# Patient Record
Sex: Female | Born: 1950 | Race: White | Hispanic: No | Marital: Married | Smoking: Former smoker
Health system: Southern US, Community
[De-identification: ages and names within clinical notes are randomized; demographics above are authoritative.]

## PROBLEM LIST (undated history)

## (undated) DIAGNOSIS — I219 Acute myocardial infarction, unspecified: Secondary | ICD-10-CM

## (undated) DIAGNOSIS — R112 Nausea with vomiting, unspecified: Secondary | ICD-10-CM

## (undated) DIAGNOSIS — I10 Essential (primary) hypertension: Secondary | ICD-10-CM

## (undated) DIAGNOSIS — K219 Gastro-esophageal reflux disease without esophagitis: Secondary | ICD-10-CM

## (undated) DIAGNOSIS — E119 Type 2 diabetes mellitus without complications: Secondary | ICD-10-CM

## (undated) DIAGNOSIS — D649 Anemia, unspecified: Secondary | ICD-10-CM

## (undated) DIAGNOSIS — Z9889 Other specified postprocedural states: Secondary | ICD-10-CM

## (undated) DIAGNOSIS — I2510 Atherosclerotic heart disease of native coronary artery without angina pectoris: Secondary | ICD-10-CM

## (undated) HISTORY — PX: CARDIAC CATHETERIZATION: SHX172

## (undated) HISTORY — PX: CHOLECYSTECTOMY: SHX55

---
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
## 1993-12-30 HISTORY — PX: ABDOMINAL HYSTERECTOMY: SHX81

## 2000-06-27 ENCOUNTER — Encounter: Payer: Self-pay | Admitting: General Surgery

## 2000-06-27 ENCOUNTER — Encounter: Admission: RE | Admit: 2000-06-27 | Discharge: 2000-06-27 | Payer: Self-pay | Admitting: General Surgery

## 2000-06-30 ENCOUNTER — Ambulatory Visit (HOSPITAL_BASED_OUTPATIENT_CLINIC_OR_DEPARTMENT_OTHER): Admission: RE | Admit: 2000-06-30 | Discharge: 2000-06-30 | Payer: Self-pay | Admitting: General Surgery

## 2000-06-30 ENCOUNTER — Encounter (INDEPENDENT_AMBULATORY_CARE_PROVIDER_SITE_OTHER): Payer: Self-pay | Admitting: *Deleted

## 2000-06-30 ENCOUNTER — Encounter: Payer: Self-pay | Admitting: General Surgery

## 2003-12-31 HISTORY — PX: CORONARY ANGIOPLASTY: SHX604

## 2004-03-31 ENCOUNTER — Emergency Department (HOSPITAL_COMMUNITY): Admission: EM | Admit: 2004-03-31 | Discharge: 2004-03-31 | Payer: Self-pay | Admitting: Emergency Medicine

## 2004-09-26 DIAGNOSIS — I219 Acute myocardial infarction, unspecified: Secondary | ICD-10-CM

## 2004-09-26 HISTORY — DX: Acute myocardial infarction, unspecified: I21.9

## 2004-09-27 ENCOUNTER — Inpatient Hospital Stay (HOSPITAL_COMMUNITY): Admission: EM | Admit: 2004-09-27 | Discharge: 2004-10-01 | Payer: Self-pay | Admitting: Emergency Medicine

## 2004-10-18 ENCOUNTER — Ambulatory Visit (HOSPITAL_COMMUNITY): Admission: RE | Admit: 2004-10-18 | Discharge: 2004-10-19 | Payer: Self-pay | Admitting: Cardiology

## 2004-11-20 ENCOUNTER — Encounter (HOSPITAL_COMMUNITY): Admission: RE | Admit: 2004-11-20 | Discharge: 2004-12-20 | Payer: Self-pay | Admitting: Cardiology

## 2008-04-16 ENCOUNTER — Ambulatory Visit (HOSPITAL_COMMUNITY): Admission: RE | Admit: 2008-04-16 | Discharge: 2008-04-16 | Payer: Self-pay | Admitting: Chiropractic Medicine

## 2008-12-11 ENCOUNTER — Emergency Department (HOSPITAL_COMMUNITY): Admission: EM | Admit: 2008-12-11 | Discharge: 2008-12-11 | Payer: Self-pay | Admitting: Emergency Medicine

## 2009-09-12 ENCOUNTER — Encounter: Admission: RE | Admit: 2009-09-12 | Discharge: 2009-09-12 | Payer: Self-pay | Admitting: Family Medicine

## 2011-02-04 IMAGING — US US ABDOMEN COMPLETE
1 series · 13 of 25 positions shown · non-contrast
Comparison: None

CLINICAL DATA: Elevated liver function test, left upper quadrant
bulge

COMPLETE ABDOMINAL ULTRASOUND

[Series 1: us abdomen complete · 0.12mm/px · 13 of 86 slices shown]
[im 1/86]
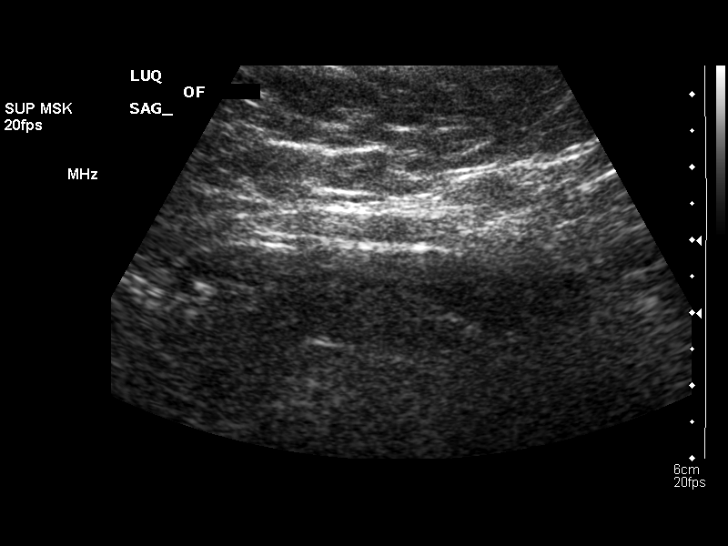
[im 8/86]
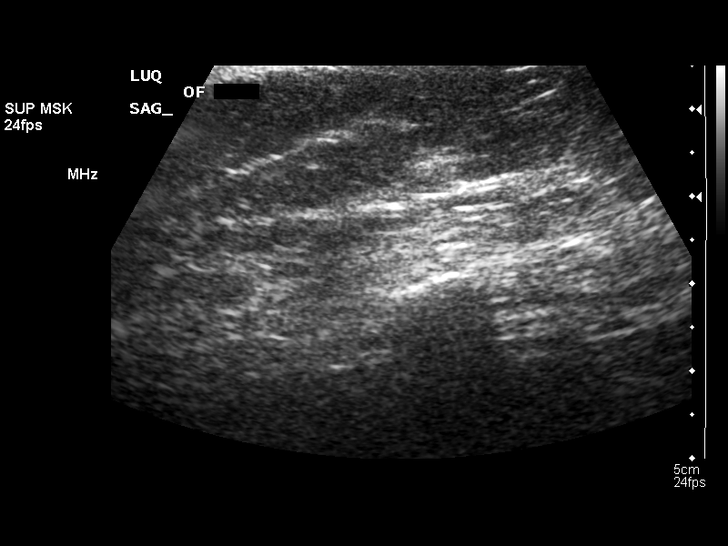
[im 15/86]
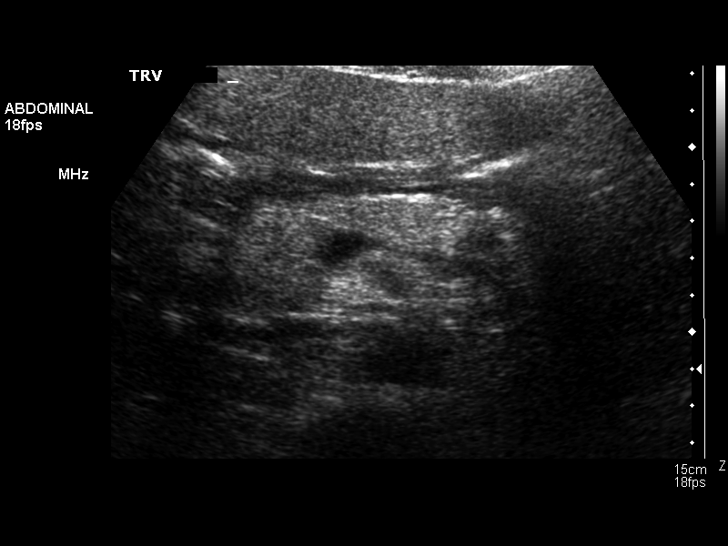
[im 22/86]
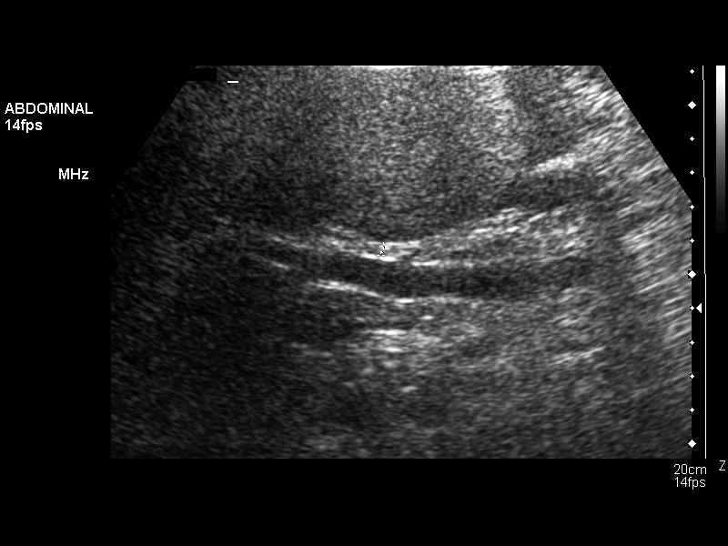
[im 29/86]
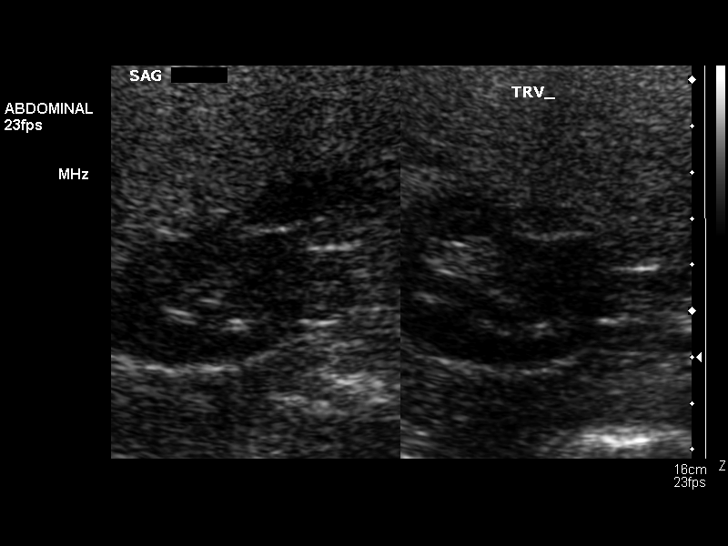
[im 36/86]
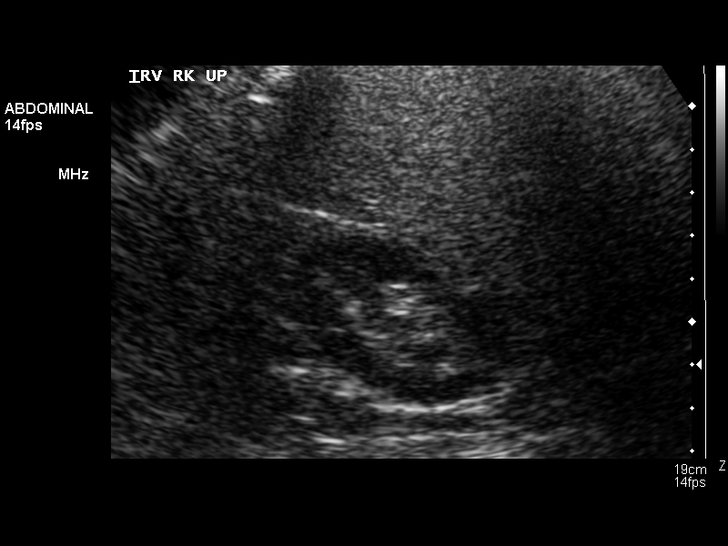
[im 43/86]
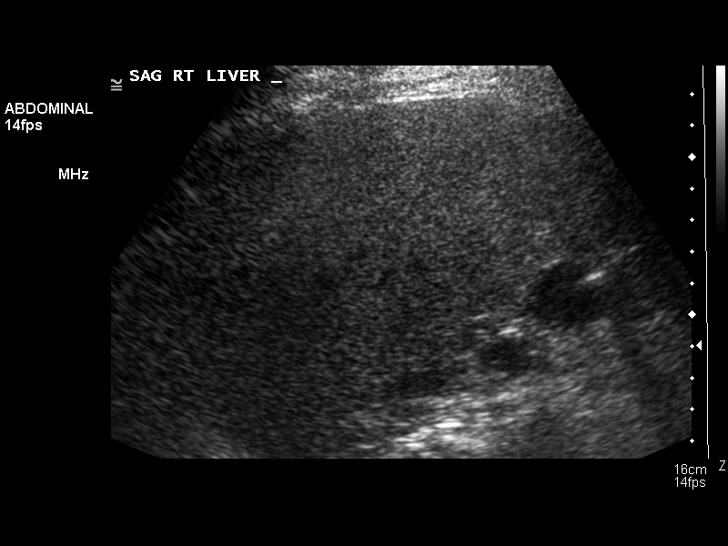
[im 50/86]
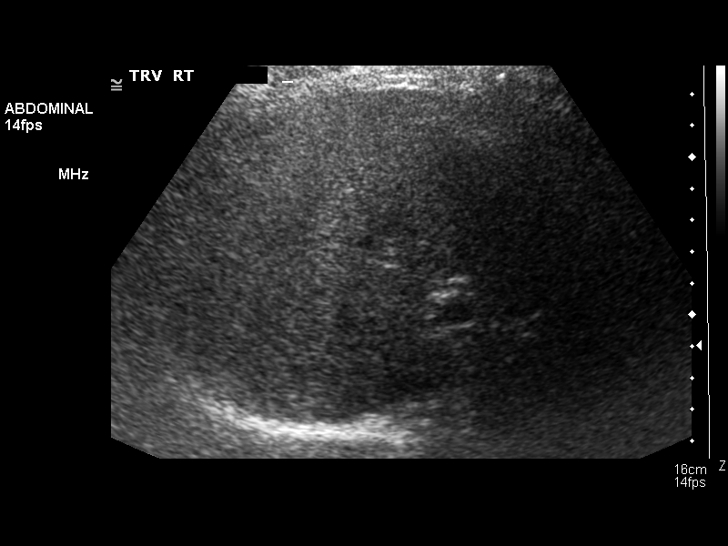
[im 57/86]
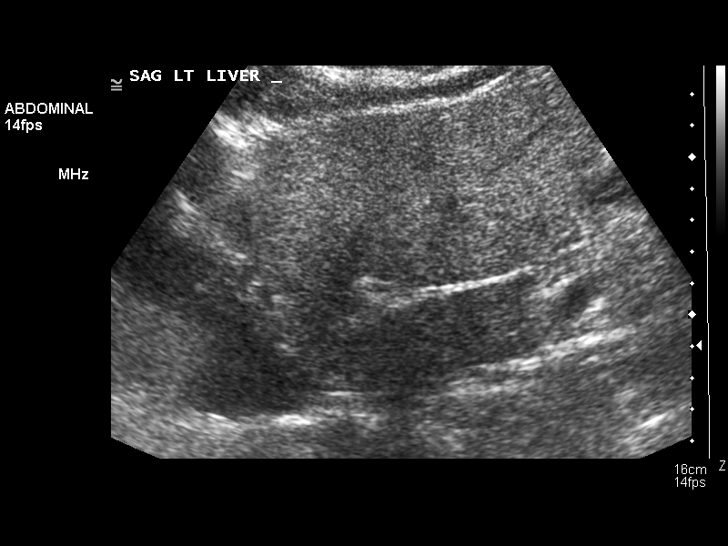
[im 64/86]
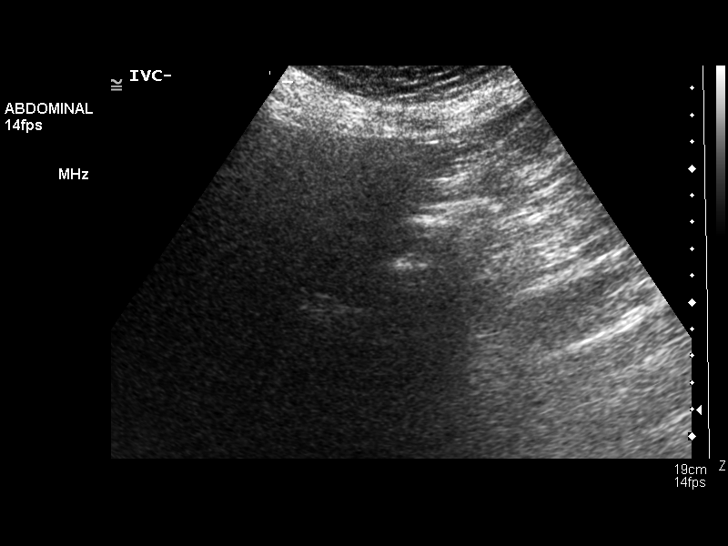
[im 71/86]
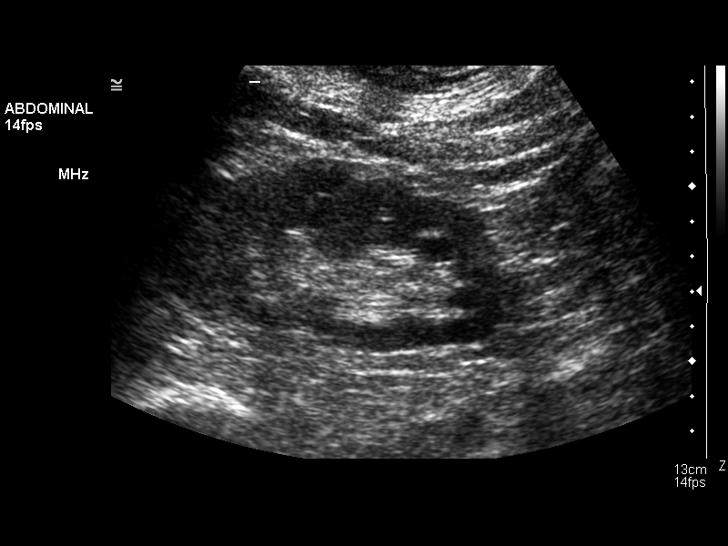
[im 78/86]
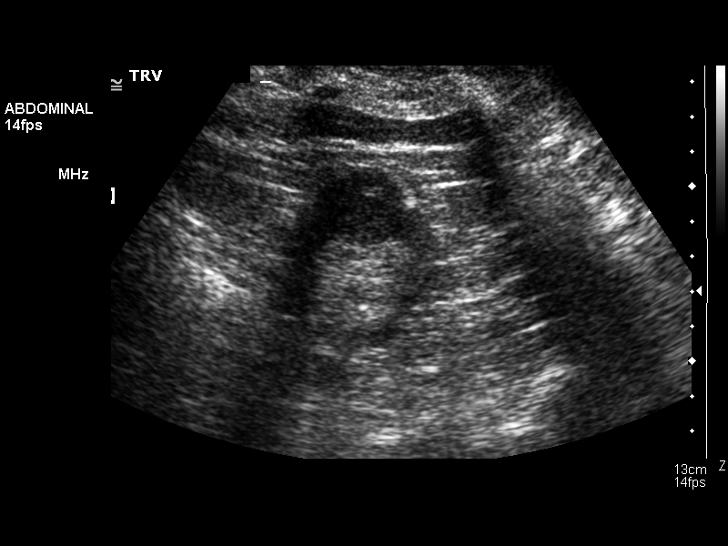
[im 86/86]
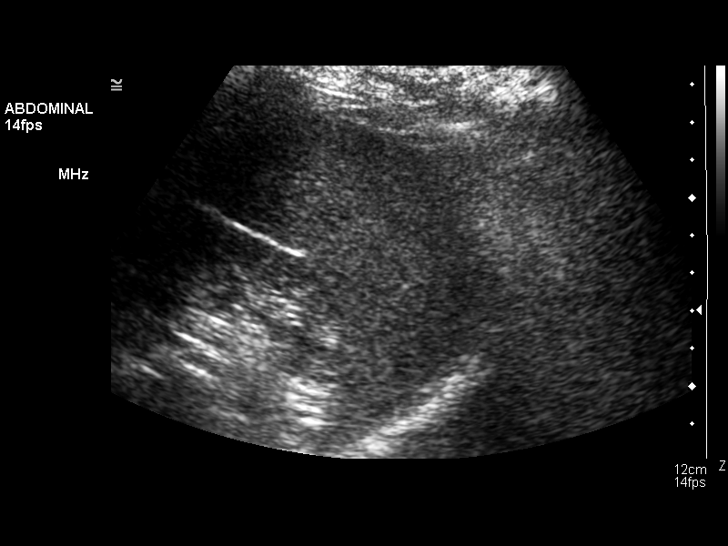

[13 of 25 positions shown; findings below may reference images not displayed]

FINDINGS: Gallbladder:  The gallbladder has been removed surgically

Common bile duct:  The common bile duct is normal measuring up to
3.2 mm in diameter distally.

Liver:  The liver is very echogenic consistent with fatty
infiltration.  No focal abnormality is seen.

IVC:  The IVC is obscured by bowel gas.

Pancreas:  The pancreas appears normal although the tail of the
pancreas is obscured by bowel gas.

Spleen:  The spleen is normal in size.

Right Kidney:  No hydronephrosis is seen.  The right kidney
measures 12.8 cm sagittally.  There is a probable complex cyst in
the upper pole of 2.2 x 2.2 x 2.1 cm.

Left Kidney:  No hydronephrosis.  Left kidney measures 13.8 cm
sagittally.  There is a lobular contour to the left kidney most
likely normal, i.e. fetal lobulation.

Abdominal aorta:  The aorta is normal in caliber.

No mass  is detected at the site questioned in the left upper
quadrant - left flank which appears fatty by ultrasound.
IMPRESSION: 1.  Fatty infiltration of the liver.  No ductal dilatation.
2.  Lobular contour of the left kidney most likely within normal
limits.  No definite mass.  Complex cyst in the upper pole of the
right kidney.
3.  At the site questioned in the left upper quadrant - left flank,
only fatty tissue is demonstrated by ultrasound.  No mass is seen.

## 2011-05-17 NOTE — H&P (Signed)
Claire Morgan, Claire Morgan                ACCOUNT NO.:  [id number]   MEDICAL RECORD NO.:  [id number]          PATIENT TYPE:  INP   LOCATION:  2921                         FACILITY:  [facility]   PHYSICIAN:  Cristy Hilts. Jacinto Halim, M.D.     DATE OF BIRTH:  [birth year]   DATE OF ADMISSION:  09/27/2004  DATE OF DISCHARGE:                                HISTORY & PHYSICAL   CHIEF COMPLAINT:  Chest pain.   HISTORY OF PRESENT ILLNESS:  Claire Morgan is a 60 year old female who is  followed by Dr. Andrey Campanile.  She has no prior history of coronary artery disease  or MI.  She presented to Dr. Tawana Scale office with complaint of chest pain  off and on x24 hours.  Her EKG was abnormal.  The patient describes  substernal chest burning which started yesterday morning while driving to  work.  Symptoms lasted a few minutes and resolved spontaneously.  She did  have some radiation to her throat and arms.  She denies any nausea,  diaphoresis or shortness of breath.  She denies any recent exertional chest  pain.  She had recurrent symptoms off and on all day yesterday.  Today, her  symptoms seemed to last longer, more than 5 minutes.  EKG in her family  doctor's office showed septal Q waves.  She was given four baby aspirin and  a nitroglycerin with resolution of her symptoms.  She is seen in the  emergency room now at [facility] after she was transferred by EMS.  Currently, she is pain free.   PAST MEDICAL HISTORY:  1.  Treated hypertension.  2.  Hyperlipidemia on diet for this.  3.  Non-insulin dependent diabetes mellitus and tries to control this with      diet.   PAST SURGICAL HISTORY:  1.  Cholecystectomy 18 years ago.  2.  Hysterectomy.  3.  Ovarian tumor removed as a teenager.  4.  Right breast lumpectomy.  5.  History of LFTs that have been elevated.  Etiology not clear.   MEDICATIONS:  1.  Avapro 150 mg a day.  2.  Toprol XL 12.5 mg a day.  3.  Premarin 6.25 mg a day.  4.  Nexium q.d.   ALLERGIES:  No  known drug allergies, but is intolerant to CODEINE.   SOCIAL HISTORY:  She is married.  She is a two pack a day smoker.  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  She has two children and four  grandchildren.  She does not drink alcohol.   FAMILY HISTORY:  Remarkable for coronary disease.  Her brother had a bypass  in his 24s.  Her mother is alive at 55 and has stents placed in the past.  Father is alive at 10 and has had bypass.   REVIEW OF SYSTEMS:  Essentially unremarkable except as noted above.  She  denies any history of GI bleeding or melena.  She has not had recent fever,  chills or hemoptysis.  She denies any kidney disease or dysuria.  She has  not had thyroid problems  in the past as far as she knows.  Review of systems  otherwise unremarkable except as noted above.   PHYSICAL EXAMINATION:  VITAL SIGNS:  Blood pressure 137/74, pulse 112,  respirations 16.  GENERAL:  She is a well-developed, overweight female in no acute distress.  HEENT:  Normocephalic, extraocular movements intact.  Sclerae nonicteric.  Conjunctivae within normal limits.  NECK:  Without bruit and without JVD.  CHEST:  Clear to auscultation and percussion.  CARDIAC:  Regular rate and rhythm without murmurs, rubs or gallops.  She  does have an S4.  ABDOMEN:  Nontender.  She has no hepatosplenomegaly.  She has a right upper  quadrant cholecystectomy scar.  EXTREMITIES:  Without edema.  Distal pulses are 3+/4.  There are no femoral  bruits noted.  NEUROLOGIC:  Grossly intact.  She is awake, alert, oriented and cooperative.  She moves all extremities without deficits.  SKIN:  Warm and dry.   LABORATORY DATA AND X-RAY FINDINGS:  EKG in the emergency room shows septal  Q waves with poor anterior R wave progression, slight ST elevation in V2 and  some anterior T wave inversion which was new from her previous EKG done at  Dr. Tawana Scale office.  Labs are pending as is her chest x-ray.   IMPRESSION:  1.   Unstable angina with possible anterior septal myocardial infarction.  2.  History of hypertension.  3.  History of non-insulin dependent diabetes mellitus, diet-controlled.  4.  History of hyperlipidemia, diet-controlled.  5.  History of smoking.  6.  Family history of coronary disease.  7.  History of elevated liver function tests with unclear etiology.  8.  Status post cholecystectomy in the past.   PLAN:  The patient was put on IV heparin and nitrates and will be taken  directly to the catheterization lab from the emergency room.       LKK/MEDQ  D:  09/28/2004  T:  09/28/2004  Job:  [id number]   cc:   Gloriajean Dell. Andrey Campanile, M.D.  [address]  [place]  [place] [address]  Fax: [phone]

## 2011-05-17 NOTE — Op Note (Signed)
[name]  Patient:    Claire Morgan, Claire Morgan                       MRN: [id number] Proc. Date: 06/30/00 Adm. Date:  [id number] Attending:  Carson Myrtle                           Operative Report  PREOPERATIVE DIAGNOSIS:  Abnormal mammogram, right.  POSTOPERATIVE DIAGNOSIS:  Abnormal mammogram, right.  PROCEDURE:  Excision with needle localization, mass right breast.  ANESTHESIA:  CRNA standby.  Ms. [name] is an, otherwise, healthy 60 year old with a new, more extensive, abnormal calcification, right breast.  She wishes to proceed with an open biopsy after options had been discussed.  The patient was brought to [facility] and a very nice needle localization was accomplished by Dr. Purcell Mouton and the patient was taken to room 7.  She was placed supine, IV started and sedation given.  The needle was nicely located at the 2 oclock position at the areolar edge of the right breast.  It appeared to penetrate the calcifications.  This area was prepped and draped in the usual fashion.  0.25% Marcaine with epinephrine was used throughout for local anesthesia.  A curvilinear incision made around the skin areolar junction and taken down through the subcutaneous tissue.  A generous core of biopsy around the shaft of the needle was excised all the way down and beyond the tip of the needle.  This was accomplished without difficulty and appeared to contain the area in question.  It was sent for specimen mammography.  Meanwhile, the bleeding was controlled with a cautery.  The wound was perfectly dry.  It was closed in layers with 3-0 Vicryl and 4-0 Monocryl.  Steri-Strips applied.  Counts correct.  Specimen mammogram appeared to me to contain the calcifications in question.  The specimen was sent to the laboratory for routine sections.  The patient tolerated it well and was removed to the recovery room in good condition.  Written and verbal instructions  given to her and her family, including Percocet 5 mg #20 and she will be seen in follow up as an outpatient. DD:    /  / TD:  07/01/00 Job: [id number]/[id number]

## 2011-05-17 NOTE — Cardiovascular Report (Signed)
NAMECHRISY, HILLEBRAND                ACCOUNT NO.:  [id number]   MEDICAL RECORD NO.:  [id number]          PATIENT TYPE:  INP   LOCATION:  2921                         FACILITY:  [facility]   PHYSICIAN:  Cristy Hilts. Jacinto Halim, M.D.     DATE OF BIRTH:  1951/07/04   DATE OF PROCEDURE:  09/27/2004  DATE OF DISCHARGE:                              CARDIAC CATHETERIZATION   PROCEDURE PERFORMED:  1.  Left ventriculography.  2.  Selective right and left coronary arteriography.  3.  Percutaneous transluminal coronary angioplasty and balloon angioplasty      of the mid left anterior descending artery.  4.  Thrombectomy using Expo catheter.  5.  Percutaneous transluminal coronary angioplasty and stenting of the mid      left anterior descending artery with a 2.5 x 23-mm Cypher followed by      post stent dilatation with a 2.75 x 200-mm Quantum.   INDICATION:  Ms. Claire Morgan is a 60 year old female with history that was  not completely available prior to me bringing the patient emergently to the  cardiac catheterization lab.  She was admitted with chest pain which is  going on since yesterday.  This afternoon, she had severe crushing chest  pain 9/10 in intensity.  She had significant dynamic EKG changes in the form  of mild ST segment elevation in 1 and AVL and also the anterior leads.  This  EKG change reverted back to baseline.  However, there was symmetrical T-wave  inversion in V1-V6 suggestive of evolving anterior wall myocardial  infarction.  Given her chest pain and markedly abnormal EKG, she was brought  to the cardiac catheterization lab on an emergent basis.   HEMODYNAMIC DATA:  1.  The left ventricular pressures were 106/3 with end-diastolic pressure of      7 mmHg.  2.  Aortic pressure 99/70 with a mean of 85 mmHg.  3.  There was no significant pressure gradient across the aortic valve.   ANGIOGRAPHIC DATA:  Left ventricle:  Left ventricular systolic function was  moderately depressed at  around 40-45% with mid to distal anterior,  anterolateral and anteroapical and inferoapical akinesis.  There was no  significant mitral regurgitation.   Right coronary artery:  The right coronary artery is a large caliber vessel  and a dominant vessel.  It is normal.   Left main coronary artery:  Left main coronary artery is a large caliber  vessel. It is normal.   Circumflex coronary artery:  Circumflex is a large caliber vessel.  It gives  origin to large obtuse marginal one and continues in the AV groove.  There  is mild luminal irregularity of the distal circumflex.  The obtuse marginal  one has a focal 50% stenosis and another 90% stenosis.   Left anterior descending artery:  Left anterior descending artery is a  moderate caliber vessel for her age.  It has diffuse disease in its distal  segment.  Gives origin to a moderate size diagonal one, however, there is  diffuse disease of the distal diagonal.  The distal diagonal has 90%  stenosis.  The LAD itself after the origin of a large septal perforator has  a 99% ulcerated complex stenosis.   INTERVENTIONAL DATA:  Successful percutaneous transluminal coronary  angioplasty and stenting of the mid left anterior descending artery.  The  stenosis was reduced from 99% to 0% with TIMI-3 to TIMI-3 flow maintained at  the end of the procedure.  A 2.5 x 23-mm Cypher stent was deployed at 12  atmospheric pressure and was post dilated with a 2.75 x 20-mm Quantum at 10  atmospheric pressure for 20 seconds.  At the end of the procedure, there was  no evidence of dissection.  No evidence of thrombus.  Excellent TIMI-3 flow  was maintained.   Balloon predilation of the mid LAD was performed with a 2.5 x 20-mm Quantum.  At that time, there was heavy thrombus burden noted at the lesion site.  The  large septal perforator had no reflow phenomena.  Hence, an Expo catheter  was utilized to perform thrombectomy.  Following thrombectomy, TIMI-3 flow   was established both in the LAD and also in the septal perforator.  Following this, the stent was deployed and the stent was post dilated with  excellent results overall.  There was no compromise in the large septal  perforator.   OVERALL IMPRESSION:  1.  Evolving acute anterior and anterolateral wall myocardial infarction      status post successful percutaneous transluminal coronary angioplasty      and stenting of the mid left anterior descending.  2.  Residual circumflex stenosis.   RECOMMENDATIONS:  The patient was initially started on ReoPro.  However,  once the lab results were available that were sent emergently, it was  realized that she has thrombocytopenia.  I also had the opportunity to  evaluate her labs from [facility] and she has chronic  thrombocytopenia and chronically elevated LFTs.  Given this, ReoPro was  discontinued.  The patient will be continued on aspirin and Plavix for now.  Although she has mildly abnormal liver enzymes, as per patient, she has had  extensive evaluation.  Hence, low dose of Zocor will be started given her  acuity of her cardiac illness.  The LFTs will be closely followed.   Evaluation for thrombocytopenia including malignancy should be considered.   TECHNIQUE OF PROCEDURE:  Under usual sterile precautions, using a 6 French  right femoral arterial access, a 6 [place] multipurpose pigtail catheter was  advanced into the ascending aorta over a 0.035-inch J wire.  The catheter  was then gently advanced to the left ventricle and left ventricular  pressures were monitored.  Hand contrast injection of the left ventricle was  performed both in the LAO and RAO projection.  The catheter was flushed with  saline and pulled back into the ascending aorta and pressure gradient across  the aortic valve was monitored.  The right coronary artery was selectively engaged and angiography was performed.  In a similar fashion, left main  coronary  artery was selectively engaged and angiography was performed.  Then, the catheter was pulled out of the body in the usual fashion.  The 7  French sheath was introduced at the femoral artery and a 7 [place] FL-3.5  guide was utilized to engage the left main coronary artery.  After having  adequate anticoagulation, a 190-cmm x 0.014 inch ATW guide wire was utilized  to cross through the LAD and the lesion length was carefully measured.  Then, a 2.75 x 20-mm Maverick  balloon was advanced over this guide wire and  after confirming the position of this balloon, the balloon was inflated at  10 atmospheric pressure for 47 seconds.  Then, the balloon was deflated and  pulled back in the guiding catheter.  200 mcg of intracoronary nitroglycerin  was administered and angiography was performed.  There was thrombus burden  noted with no reflow into the large septal perforator.  Then, an Expo  catheter was utilized and two passes were made and after the passing of the  Expo catheter, excellent TIMI-3 flow was established and also markedly  improved thrombus burden.  Then, a 2.5 x 23-mm Cypher stent was advanced  over the same wire and after confirming the position of the stent, the stent  was deployed at 12 atmospheric pressure for 33 seconds and then  post dilated with a 2.75 x 20-mm Quantum at 10 atmospheric pressures.  Overall, the stenosis was reduced from 99% to 0% with TIMI-3 to TIMI-3 flow  maintained at the end of the procedure.  The patient tolerated the procedure  well.  There was no immediate complications noted.  190 mL of contrast was  administered during the angiography.       JRG/MEDQ  D:  09/27/2004  T:  09/28/2004  Job:  [id number]

## 2011-05-17 NOTE — Discharge Summary (Signed)
Claire Morgan, BAYLESS                ACCOUNT NO.:  [id number]   MEDICAL RECORD NO.:  [id number]          PATIENT TYPE:  INP   LOCATION:  3714                         FACILITY:  [facility]   PHYSICIAN:  Cristy Hilts. Jacinto Halim, M.D.     DATE OF BIRTH:  February 07, 1951   DATE OF ADMISSION:  09/27/2004  DATE OF DISCHARGE:  10/01/2004                                 DISCHARGE SUMMARY   ADMISSION DIAGNOSES:  1.  Acute myocardial infarction.  2.  Hypertension.  3.  Non-insulin-dependent diabetes mellitus, diet-controlled.  4.  Hyperlipidemia, diet-controlled.  5.  History of slightly elevated liver function tests in the past,      questionable etiology.  6.  Smoker.  7.  Family history of coronary artery disease.  8.  History of hysterectomy.  9.  History of ovarian tumor as a teenager.  10. History of cholecystectomy 18 years ago.  11. History of lumpectomy on the right.   DISCHARGE DIAGNOSES:  1.  Acute myocardial infarction.  2.  Status post emergency cardiac catheterization by Dr. Cristy Hilts. Jacinto Halim on      September 27, 2004.  He performed percutaneous transluminal coronary      angioplasty/stent to the left anterior descending.  She does have      residual coronary artery disease which will need followup.  3.  Ejection fraction at 45% at catheterization.  4.  Hypertension.  5.  Non-insulin-dependent diabetes mellitus, diet-controlled.  6.  Hyperlipidemia, diet-controlled.  7.  History of slightly elevated liver function tests in the past,      questionable etiology.  8.  Smoker.  9.  Family history of coronary artery disease.  10. History of hysterectomy.  11. History of ovarian tumor as a teenager.  12. History of cholecystectomy 18 years ago.  13. History of lumpectomy on the right.   HISTORY OF PRESENT ILLNESS:  Claire Morgan is a 60 year old white female who  presented to [facility] on the evening of September 27, 2004 with  complaints of chest pain.  She had no prior cardiac history.  At that  time,  she was sent to the ER from Dr. Gloriajean Dell. Wilson's office with history of  chest pain off and on over the last 24 hours and abnormal EKG in his office.  She described the substernal chest pain as a burning which started on the  previous morning.  Her pain radiated to the throat and arms.  There was no  nausea, diaphoresis or shortness of breath.  The symptoms had resolved  spontaneously, but recurred off and on throughout the last 2 days.  On the  day of admission, the symptoms lasted longer, over 5 minutes per episode.  The pain improved with nitroglycerin at the primary care physician's office.  There, she also receive 4 baby aspirin.  At the time of our evaluation in  the ER, she was pain-free.  EKG was abnormal with septal Q wave, poor  anterior R wave progression and slight ST elevation in V2 and she had  anterior T wave inversions which were new from earlier  EKG.   At that point, labs were pending, she was hemodynamically stable with  positive S4 on exam, but no other significant abnormalities on physical  exam.   At this point, she was seen and evaluated by Dr. Cristy Hilts. Ganji.  At this  point, she was presently pain-free.  However, with her EKG changes and  symptomatology, he felt that we needed to proceed with urgent cardiac  catheterization.  Risks and benefits of the procedure were discussed with  the patient and she was agreeable to proceed.   HOSPITAL COURSE:  On September 27, 2004, Claire Morgan underwent urgent cardiac  catheterization by Dr. Yates Decamp.  Please see the dictated report for  details.  He performed PTCA with CYPHER stenting to the LAD which went from  99% stenosis to 0% residual.  She does have significant residual disease  including a 90% stenosis of the circumflex.  EF was 45%.  There was no MR.  She tolerated the procedure well without complications.  Note that during  the case, they did get lab values back and she was thrombocytopenic,  therefore, ReoPro  was discontinued at that point.   On September 28, 2004, Claire Morgan remained stable with no chest pain.  Her  blood pressure is low in the 80s over 30s and 40s though.  Therefore, at  that time, Inspra was stopped because of hypotension and she was given  fluids.  Otherwise, she was stable.   On September 28, 2004, she underwent tobacco cessation consult.  She stated  that she had smoked her last cigarette, that she was quitting smoking at  this time.  She did not wish to have any type of assistance with this as far  as prescriptions, etc.   On September 29, 2004, she remained stable without any chest pain or other  problems.  She was transferred to telemetry.  Her blood pressure is improved  to 105/50 off of Inspra.   On September 30, 2004, she was stable.  She was having some atypical chest pain  at that point and also some headache and fatigue.  She was hemodynamically  stable, labs were stable.  Dr. Darlin Priestly planned to watch her for 24  more hours and if stable, would consider discharge in the morning.   On October 01, 2004, Claire Morgan was filling well with no chest pain or  shortness of breath.  She has been walking in the hallways without  complications.  She is not having any significant lightheadedness or other  problems.  She is maintaining sinus rhythm, sinus bradycardia, 56 and 70  beats a minute.  Her labs are stable.  Heart is in regular rhythm.  Lungs  are clear.  Her groin site has some minimal ecchymosis, but no hematoma or  bleed.  At this point, she is seen and evaluated by Dr. Lenise Herald, who  deems her stable for discharge home.   HOSPITAL CONSULTS:  None.   HOSPITAL PROCEDURES:  She underwent urgent cardiac catheterization by Dr.  Yates Decamp on September 27, 2004.  At that time, he performed percutaneous  transluminal coronary angioplasty/stent to the left anterior descending using a CYPHER stent.  She did have residual circumflex disease including a  90%  stenosis.  Ejection fraction was 45%.  Please see the dictated report  for details.  She tolerated the procedure well without complications.   EKG on admission showed sinus rhythm, septal Q waves, poor anterior R wave  progression  and slight ST elevation in V1 and anterior T wave inversions  which were new changes.   Telemetry throughout the hospitalization showed sinus rhythm and sinus  bradycardia, no other significant arrhythmia.   Chest x-ray on September 27, 2004 shows no active disease.   LABORATORY:  C-reactive protein at 0.2.  TSH normal at 1.145.  Lipid profile  shows total cholesterol 206, triglycerides 101, HDL 50, LDL 136.  Homocysteine is elevated at 16.32.  Cardiac enzymes shows CKs of 191, 235,  220.  CK-MBs were 11.1, 17.8, 15.5.  Troponin is 1.20, 2.53, 2.62.  LFTs are  normal except an AST slightly elevated at 46, ALT slightly elevated at 44,  other LFTs are completely normal.  On admission, sodium was 138, potassium  3.7, glucose 140, BUN 6, creatinine 0.9.  On admission, PT 13.4, INR 1.0,  PTT was elevated on heparin.  On admission, white count 6.6, hemoglobin  13.4, hematocrit 39.9, platelet count 132,000.   DISCHARGE MEDICATIONS:  1.  Aspirin 81 mg a day.  2.  Plavix 75 mg a day.  3.  Toprol-XL 25 mg a day.  4.  Altace 1.25 mg nightly.  5.  Zocor 20 mg a day.  6.  Nexium 40 mg a day.  7.  Premarin 0.625 mg a day.  8.  Foltx once a day.  9.  Stop your Avapro.  10. Nitroglycerin 0.4 mg sublingual as directed.   ACTIVITY:  No strenuous activity until you see Dr. Jacinto Halim.   No lifting greater than 5 pounds, driving or sexual activity for 3 days.   DIET:  Low-cholesterol diabetic diet.   WOUND CARE:  You may gently wash your groin site with warm water and soap.   DISCHARGE INSTRUCTIONS:  Have your blood drawn to check a CBC and a CMET in  about 1 week.   FOLLOWUP:  Follow up with Dr. Jacinto Halim, October 12, 2004 at 10:45 a.m.       MBE/MEDQ  D:  10/01/2004   T:  10/01/2004  Job:  [id number]   cc:   Gloriajean Dell. Andrey Campanile, M.D.  [address]  [place]  [place] [address]  Fax: [phone]

## 2011-06-17 DIAGNOSIS — E785 Hyperlipidemia, unspecified: Secondary | ICD-10-CM | POA: Insufficient documentation

## 2015-05-15 ENCOUNTER — Other Ambulatory Visit: Payer: Self-pay | Admitting: Cardiology

## 2015-11-02 DIAGNOSIS — I10 Essential (primary) hypertension: Secondary | ICD-10-CM | POA: Insufficient documentation

## 2015-11-02 DIAGNOSIS — K219 Gastro-esophageal reflux disease without esophagitis: Secondary | ICD-10-CM | POA: Insufficient documentation

## 2017-12-30 HISTORY — PX: CATARACT EXTRACTION: SUR2

## 2019-05-21 ENCOUNTER — Other Ambulatory Visit: Payer: Self-pay | Admitting: Orthopedic Surgery

## 2019-05-27 ENCOUNTER — Encounter: Payer: Self-pay | Admitting: Cardiology

## 2019-05-27 NOTE — Progress Notes (Signed)
  05/27/2019  Re: [name] DOB: [birth year] MRN: [id number]  Address: [address] [place] [address]   Dear Cindee Salt, MD,   Claire Morgan is at low risk, from a cardiac standpoint,  For her upcoming procedure.  It is ok to proceed without further cardiac testing.  If necessary to absolutely hold aspirin, please stop 1 week before.  Please call Dept: [phone] with any additional questions.   Sincerely,   Yates Decamp, MD, [facility]

## 2019-06-03 ENCOUNTER — Other Ambulatory Visit: Payer: Self-pay

## 2019-06-03 ENCOUNTER — Encounter (HOSPITAL_BASED_OUTPATIENT_CLINIC_OR_DEPARTMENT_OTHER): Payer: Self-pay | Admitting: *Deleted

## 2019-06-07 ENCOUNTER — Encounter (HOSPITAL_BASED_OUTPATIENT_CLINIC_OR_DEPARTMENT_OTHER)
Admission: RE | Admit: 2019-06-07 | Discharge: 2019-06-07 | Disposition: A | Discharge status: Home / Self Care | Payer: Medicare Other | Source: Ambulatory Visit | Attending: Orthopedic Surgery | Admitting: Orthopedic Surgery

## 2019-06-07 ENCOUNTER — Other Ambulatory Visit: Payer: Self-pay

## 2019-06-07 ENCOUNTER — Other Ambulatory Visit (HOSPITAL_COMMUNITY)
Admission: RE | Admit: 2019-06-07 | Discharge: 2019-06-07 | Disposition: A | Discharge status: Home / Self Care | Payer: Medicare Other | Source: Ambulatory Visit | Attending: Orthopedic Surgery | Admitting: Orthopedic Surgery

## 2019-06-07 DIAGNOSIS — Z01812 Encounter for preprocedural laboratory examination: Secondary | ICD-10-CM | POA: Insufficient documentation

## 2019-06-07 DIAGNOSIS — Z1159 Encounter for screening for other viral diseases: Secondary | ICD-10-CM | POA: Insufficient documentation

## 2019-06-07 LAB — BASIC METABOLIC PANEL
Anion gap: 8 (ref 5–15)
BUN: 13 mg/dL (ref 8–23)
CO2: 28 mmol/L (ref 22–32)
Calcium: 9.5 mg/dL (ref 8.9–10.3)
Chloride: 106 mmol/L (ref 98–111)
Creatinine, Ser: 0.65 mg/dL (ref 0.44–1.00)
GFR calc Af Amer: >60 mL/min (ref >60)
GFR calc non Af Amer: >60 mL/min (ref >60)
Glucose, Bld: 105 mg/dL — ABNORMAL HIGH (ref 70–99)
Potassium: 5 mmol/L (ref 3.5–5.1)
Sodium: 142 mmol/L (ref 135–145)

## 2019-06-07 NOTE — Progress Notes (Signed)
G2 pre surgical drink given to patient with instructions to finish by 0530 DOS. Pt verbalized understanding

## 2019-06-08 LAB — NOVEL CORONAVIRUS, NAA (HOSP ORDER, SEND-OUT TO REF LAB; TAT 18-24 HRS): SARS-CoV-2, NAA: NOT DETECTED

## 2019-06-10 ENCOUNTER — Ambulatory Visit (HOSPITAL_BASED_OUTPATIENT_CLINIC_OR_DEPARTMENT_OTHER)
Admission: RE | Admit: 2019-06-10 | Discharge: 2019-06-10 | Disposition: A | Discharge status: Home / Self Care | Payer: Medicare Other | Attending: Orthopedic Surgery | Admitting: Orthopedic Surgery

## 2019-06-10 ENCOUNTER — Other Ambulatory Visit: Payer: Self-pay

## 2019-06-10 ENCOUNTER — Encounter (HOSPITAL_BASED_OUTPATIENT_CLINIC_OR_DEPARTMENT_OTHER): Payer: Self-pay | Admitting: Anesthesiology

## 2019-06-10 ENCOUNTER — Encounter (HOSPITAL_BASED_OUTPATIENT_CLINIC_OR_DEPARTMENT_OTHER)
Admission: RE | Disposition: A | Discharge status: Home / Self Care | Payer: Self-pay | Source: Home / Self Care | Attending: Orthopedic Surgery

## 2019-06-10 ENCOUNTER — Ambulatory Visit (HOSPITAL_BASED_OUTPATIENT_CLINIC_OR_DEPARTMENT_OTHER): Payer: Medicare Other | Admitting: Anesthesiology

## 2019-06-10 DIAGNOSIS — E119 Type 2 diabetes mellitus without complications: Secondary | ICD-10-CM | POA: Diagnosis not present

## 2019-06-10 DIAGNOSIS — I2510 Atherosclerotic heart disease of native coronary artery without angina pectoris: Secondary | ICD-10-CM | POA: Insufficient documentation

## 2019-06-10 DIAGNOSIS — M65341 Trigger finger, right ring finger: Secondary | ICD-10-CM | POA: Diagnosis not present

## 2019-06-10 DIAGNOSIS — I10 Essential (primary) hypertension: Secondary | ICD-10-CM | POA: Insufficient documentation

## 2019-06-10 DIAGNOSIS — Z87891 Personal history of nicotine dependence: Secondary | ICD-10-CM | POA: Insufficient documentation

## 2019-06-10 DIAGNOSIS — I252 Old myocardial infarction: Secondary | ICD-10-CM | POA: Insufficient documentation

## 2019-06-10 DIAGNOSIS — M65311 Trigger thumb, right thumb: Secondary | ICD-10-CM | POA: Diagnosis present

## 2019-06-10 HISTORY — DX: Anemia, unspecified: D64.9

## 2019-06-10 HISTORY — DX: Gastro-esophageal reflux disease without esophagitis: K21.9

## 2019-06-10 HISTORY — DX: Atherosclerotic heart disease of native coronary artery without angina pectoris: I25.10

## 2019-06-10 HISTORY — DX: Type 2 diabetes mellitus without complications: E11.9

## 2019-06-10 HISTORY — PX: TRIGGER FINGER RELEASE: SHX641

## 2019-06-10 HISTORY — DX: Essential (primary) hypertension: I10

## 2019-06-10 HISTORY — DX: Other specified postprocedural states: Z98.890

## 2019-06-10 HISTORY — DX: Nausea with vomiting, unspecified: R11.2

## 2019-06-10 LAB — GLUCOSE, CAPILLARY: Glucose-Capillary: 114 mg/dL — ABNORMAL HIGH (ref 70–99)

## 2019-06-10 SURGERY — RELEASE, A1 PULLEY, FOR TRIGGER FINGER
Anesthesia: Regional | Site: Hand | Laterality: Right

## 2019-06-10 MED ORDER — LIDOCAINE HCL (PF) 1 % IJ SOLN
INTRAMUSCULAR | Status: AC
  Filled 2019-06-10: qty 5

## 2019-06-10 MED ORDER — CEFAZOLIN SODIUM-DEXTROSE 2-4 GM/100ML-% IV SOLN
2.0000 g | INTRAVENOUS | Status: AC
  Administered 2019-06-10: 2 g via INTRAVENOUS

## 2019-06-10 MED ORDER — HYDROMORPHONE HCL 1 MG/ML IJ SOLN: 0.2500 mg | INTRAMUSCULAR | Status: DC | PRN

## 2019-06-10 MED ORDER — CHLORHEXIDINE GLUCONATE 4 % EX LIQD: 60.0000 mL | Freq: Once | CUTANEOUS | Status: DC

## 2019-06-10 MED ORDER — LACTATED RINGERS IV SOLN
INTRAVENOUS | Status: DC
  Administered 2019-06-10: 08:00:00 via INTRAVENOUS

## 2019-06-10 MED ORDER — BUPIVACAINE HCL (PF) 0.25 % IJ SOLN
INTRAMUSCULAR | Status: DC | PRN
  Administered 2019-06-10: 9 mL

## 2019-06-10 MED ORDER — BUPIVACAINE HCL (PF) 0.25 % IJ SOLN
INTRAMUSCULAR | Status: AC
  Filled 2019-06-10: qty 30

## 2019-06-10 MED ORDER — MEPERIDINE HCL 25 MG/ML IJ SOLN: 6.2500 mg | INTRAMUSCULAR | Status: DC | PRN

## 2019-06-10 MED ORDER — DEXAMETHASONE SODIUM PHOSPHATE 10 MG/ML IJ SOLN
INTRAMUSCULAR | Status: AC
  Filled 2019-06-10: qty 1

## 2019-06-10 MED ORDER — FENTANYL CITRATE (PF) 100 MCG/2ML IJ SOLN
INTRAMUSCULAR | Status: AC
  Filled 2019-06-10: qty 2

## 2019-06-10 MED ORDER — SCOPOLAMINE 1 MG/3DAYS TD PT72: 1.0000 | MEDICATED_PATCH | Freq: Once | TRANSDERMAL | Status: DC | PRN

## 2019-06-10 MED ORDER — TRAMADOL HCL 50 MG PO TABS: 50.0000 mg | ORAL_TABLET | Freq: Four times a day (QID) | ORAL | 0 refills | Status: DC | PRN

## 2019-06-10 MED ORDER — CEFAZOLIN SODIUM-DEXTROSE 2-4 GM/100ML-% IV SOLN
INTRAVENOUS | Status: AC
  Filled 2019-06-10: qty 100

## 2019-06-10 MED ORDER — ONDANSETRON HCL 4 MG/2ML IJ SOLN
INTRAMUSCULAR | Status: AC
  Filled 2019-06-10: qty 2

## 2019-06-10 MED ORDER — FENTANYL CITRATE (PF) 100 MCG/2ML IJ SOLN
50.0000 ug | INTRAMUSCULAR | Status: DC | PRN
  Administered 2019-06-10 (×2): 50 ug via INTRAVENOUS

## 2019-06-10 MED ORDER — DEXAMETHASONE SODIUM PHOSPHATE 4 MG/ML IJ SOLN
INTRAMUSCULAR | Status: DC | PRN
  Administered 2019-06-10: 4 mg via INTRAVENOUS

## 2019-06-10 MED ORDER — ONDANSETRON HCL 4 MG/2ML IJ SOLN
INTRAMUSCULAR | Status: DC | PRN
  Administered 2019-06-10: 4 mg via INTRAVENOUS

## 2019-06-10 MED ORDER — PROPOFOL 10 MG/ML IV BOLUS
INTRAVENOUS | Status: DC | PRN
  Administered 2019-06-10 (×3): 10 mg via INTRAVENOUS

## 2019-06-10 MED ORDER — MIDAZOLAM HCL 2 MG/2ML IJ SOLN: 1.0000 mg | INTRAMUSCULAR | Status: DC | PRN

## 2019-06-10 MED ORDER — ONDANSETRON HCL 4 MG/2ML IJ SOLN: 4.0000 mg | Freq: Once | INTRAMUSCULAR | Status: DC | PRN

## 2019-06-10 MED ORDER — LIDOCAINE 2% (20 MG/ML) 5 ML SYRINGE
INTRAMUSCULAR | Status: AC
  Filled 2019-06-10: qty 5

## 2019-06-10 SURGICAL SUPPLY — 33 items
APL PRP STRL LF DISP 70% ISPRP (MISCELLANEOUS) ×1
BLADE SURG 15 STRL LF DISP TIS (BLADE) ×1 IMPLANT
BLADE SURG 15 STRL SS (BLADE) ×3
BNDG CMPR 9X4 STRL LF SNTH (GAUZE/BANDAGES/DRESSINGS) ×1
BNDG COHESIVE 2X5 TAN STRL LF (GAUZE/BANDAGES/DRESSINGS) ×3 IMPLANT
BNDG ESMARK 4X9 LF (GAUZE/BANDAGES/DRESSINGS) ×2 IMPLANT
CHLORAPREP W/TINT 26 (MISCELLANEOUS) ×3 IMPLANT
CORD BIPOLAR FORCEPS 12FT (ELECTRODE) IMPLANT
COVER BACK TABLE REUSABLE LG (DRAPES) ×3 IMPLANT
COVER MAYO STAND REUSABLE (DRAPES) ×3 IMPLANT
COVER WAND RF STERILE (DRAPES) IMPLANT
CUFF TOURN SGL QUICK 18X4 (TOURNIQUET CUFF) ×2 IMPLANT
DECANTER SPIKE VIAL GLASS SM (MISCELLANEOUS) ×2 IMPLANT
DRAPE EXTREMITY T 121X128X90 (DISPOSABLE) ×3 IMPLANT
DRAPE SURG 17X23 STRL (DRAPES) ×3 IMPLANT
GAUZE SPONGE 4X4 12PLY STRL (GAUZE/BANDAGES/DRESSINGS) ×3 IMPLANT
GAUZE XEROFORM 1X8 LF (GAUZE/BANDAGES/DRESSINGS) ×3 IMPLANT
GLOVE BIOGEL PI IND STRL 8.5 (GLOVE) ×1 IMPLANT
GLOVE BIOGEL PI INDICATOR 8.5 (GLOVE) ×2
GLOVE SURG ORTHO 8.0 STRL STRW (GLOVE) ×3 IMPLANT
GOWN STRL REUS W/ TWL LRG LVL3 (GOWN DISPOSABLE) ×1 IMPLANT
GOWN STRL REUS W/TWL LRG LVL3 (GOWN DISPOSABLE) ×3
GOWN STRL REUS W/TWL XL LVL3 (GOWN DISPOSABLE) ×3 IMPLANT
NDL PRECISIONGLIDE 27X1.5 (NEEDLE) ×1 IMPLANT
NEEDLE PRECISIONGLIDE 27X1.5 (NEEDLE) ×3 IMPLANT
NS IRRIG 1000ML POUR BTL (IV SOLUTION) ×3 IMPLANT
PACK BASIN DAY SURGERY FS (CUSTOM PROCEDURE TRAY) ×3 IMPLANT
STOCKINETTE 4X48 STRL (DRAPES) ×3 IMPLANT
SUT ETHILON 4 0 PS 2 18 (SUTURE) ×3 IMPLANT
SYR BULB 3OZ (MISCELLANEOUS) ×3 IMPLANT
SYR CONTROL 10ML LL (SYRINGE) ×3 IMPLANT
TOWEL GREEN STERILE FF (TOWEL DISPOSABLE) ×6 IMPLANT
UNDERPAD 30X30 (UNDERPADS AND DIAPERS) ×3 IMPLANT

## 2019-06-10 NOTE — Discharge Instructions (Addendum)

## 2019-06-10 NOTE — Anesthesia Preprocedure Evaluation (Signed)
Anesthesia Evaluation  Patient identified by MRN, date of birth, ID band Patient awake    Reviewed: Allergy & Precautions, NPO status , Patient's Chart, lab work & pertinent test results  History of Anesthesia Complications (+) PONV  Airway Mallampati: I  TM Distance: >3 FB Neck ROM: Full    Dental   Pulmonary former smoker,    Pulmonary exam normal        Cardiovascular hypertension, Pt. on medications + CAD and + Past MI  Normal cardiovascular exam     Neuro/Psych    GI/Hepatic GERD  Medicated and Controlled,  Endo/Other  diabetes, Type 2, Oral Hypoglycemic Agents  Renal/GU      Musculoskeletal   Abdominal   Peds  Hematology   Anesthesia Other Findings   Reproductive/Obstetrics                             Anesthesia Physical Anesthesia Plan  ASA: II  Anesthesia Plan: Bier Block and Bier Block-LIDOCAINE ONLY   Post-op Pain Management:    Induction:   PONV Risk Score and Plan: 3 and Ondansetron and Midazolam  Airway Management Planned: Simple Face Mask  Additional Equipment:   Intra-op Plan:   Post-operative Plan:   Informed Consent: I have reviewed the patients History and Physical, chart, labs and discussed the procedure including the risks, benefits and alternatives for the proposed anesthesia with the patient or authorized representative who has indicated his/her understanding and acceptance.       Plan Discussed with: CRNA and Surgeon  Anesthesia Plan Comments:         Anesthesia Quick Evaluation

## 2019-06-10 NOTE — Anesthesia Procedure Notes (Signed)
Anesthesia Regional Block: Bier block (IV Regional)   Pre-Anesthetic Checklist: ,, timeout performed, Correct Patient, Correct Site, Correct Laterality, Correct Procedure,, site marked, surgical consent,, at surgeon's request  Laterality: Right     Needles:  Injection technique: Single-shot  Needle Type: Other      Needle Gauge: 22     Additional Needles:   Procedures:,,,,, intact distal pulses, Esmarch exsanguination, single tourniquet utilized,  Narrative:  Start time: 06/10/2019 9:24 AM End time: 06/10/2019 9:25 AM Injection made incrementally with aspirations every 35 mL.  Performed by: Personally   Additional Notes: Esmark wrap, torq inflated to 290, neg pulse, slowly injected pres free 0.5% 103ml, pt tol well. No neg s/s

## 2019-06-10 NOTE — Brief Op Note (Signed)
06/10/2019  9:51 AM  PATIENT:  Claire Morgan  68 y.o. female  PRE-OPERATIVE DIAGNOSIS:  TRIGGER RIGHT THUMB AND RING FINGER  POST-OPERATIVE DIAGNOSIS:  TRIGGER RIGHT THUMB AND RING FINGER  PROCEDURE:  Procedure(s) with comments: RELEASE TRIGGER FINGER/A-1 PULLEY RIGHT THUMB AND RING (Right) - FAB  SURGEON:  Surgeon(s) and Role:    * Daryll Brod, MD - Primary  PHYSICIAN ASSISTANT:   ASSISTANTS: none   ANESTHESIA:   local, regional and IV sedation  EBL:  5 mL   BLOOD ADMINISTERED:none  DRAINS: none   LOCAL MEDICATIONS USED:  BUPIVICAINE   SPECIMEN:  No Specimen  DISPOSITION OF SPECIMEN:  N/A  COUNTS:  YES  TOURNIQUET:   Total Tourniquet Time Documented: Forearm (Right) - 25 minutes Total: Forearm (Right) - 25 minutes   DICTATION: .Dragon Dictation  PLAN OF CARE: Discharge to home after PACU  PATIENT DISPOSITION:  PACU - hemodynamically stable.

## 2019-06-10 NOTE — Transfer of Care (Signed)
Immediate Anesthesia Transfer of Care Note  Patient: Claire Morgan  Procedure(s) Performed: RELEASE TRIGGER FINGER/A-1 PULLEY RIGHT THUMB AND RING (Right Hand)  Patient Location: PACU  Anesthesia Type:MAC and Bier block  Level of Consciousness: awake and alert   Airway & Oxygen Therapy: Patient Spontanous Breathing and Patient connected to nasal cannula oxygen  Post-op Assessment: Report given to RN and Post -op Vital signs reviewed and stable  Post vital signs: Reviewed and stable  Last Vitals:  Vitals Value Taken Time  BP    Temp    Pulse 77 06/10/19 0954  Resp 14 06/10/19 0954  SpO2 100 % 06/10/19 0954  Vitals shown include unvalidated device data.  Last Pain:  Vitals:   06/10/19 0759  TempSrc: Oral  PainSc: 0-No pain      Patients Stated Pain Goal: 0 (85/90/93 1121)  Complications: No apparent anesthesia complications

## 2019-06-10 NOTE — Op Note (Signed)
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   ASSISTANT: none   ANESTHESIA:  Bier block with sedation and Local   INTRAVENOUS FLUIDS:  Per anesthesia flow sheet.   ESTIMATED BLOOD LOSS:  Minimal.   COMPLICATIONS:  None.   SPECIMENS:  none   TOURNIQUET TIME:    Total Tourniquet Time Documented: Forearm (Right) - 25 minutes Total: Forearm (Right) - 25 minutes    DISPOSITION:  Stable to PACU.   INDICATIONS: Patient is a 68 year old female who has had catching of her right thumb and right ring finger which is not responded to conservative treatment.  She has elected undergo surgical release of the A1 pulleys of each of those digits.  Pre-peri-and postoperative course been discussed along with risks and complications.  She is aware that there is no guarantee to the surgery the possibility of infection recurrence injury to arteries nerves tendons and complete relief of symptoms and dystrophy.  In the preoperative area the patient is seen extremity marked by both patient and surgeon antibiotic given  OPERATIVE COURSE: Patient is brought to the operating room where form based IV regional anesthetic was carried out without difficulty under the direction the anesthesia department.  She was prepped using ChloraPrep and in supine position with the right arm free.  A three-minute dry time was allowed and a timeout taken confirming patient procedure.  A transverse incision was made over the metacarpal phalangeal joint crease of the right thumb carried down through subcutaneous tissue.  Neurovascular  structures were retracted radially and ulnarly.  The A1 pulley was identified this was released on its radial aspect the oblique pulley was left intact.  The tenosynovial treated tissue proximally was separated with blunt dissection.  The thumb placed through full range of motion no further triggering was noted and full mobility was noted.  The ring finger trigger was attended to next.  An oblique incision was made over the A1 pulley carried down through subcutaneous tissue.  Retractors were again placed retracting neurovascular structures radially and ulnarly.  The A1 pulley was then released on its radial aspect is small incision was made centrally and A2.  Tenosynovial tissue was separated there was fatty tissue within the tenosynovial tissue of the superficialis.  This was removed.  Wound was copiously irrigated with saline after the finger was placed through full range of motion no further triggering was noted and the 2 tendons were separated with blunt dissection.  Each of the wounds were irrigated and closed with interrupted 4 nylon sutures.  Thorough compressive dressing with the fingers free was applied.  Deflation of the tourniquet all fingers immediately pink.  She was taken to the recovery room for observation in satisfactory condition.  She will be discharged home to return the hand center [facility] in 1 week on Tylenol ibuprofen for pain with Ultram for breakthrough.   Daryll Brod, MD Electronically signed, 06/10/19

## 2019-06-10 NOTE — Anesthesia Postprocedure Evaluation (Signed)
Anesthesia Post Note  Patient: Claire Morgan  Procedure(s) Performed: RELEASE TRIGGER FINGER/A-1 PULLEY RIGHT THUMB AND RING (Right Hand)     Patient location during evaluation: PACU Anesthesia Type: Bier Block Level of consciousness: awake and alert Pain management: pain level controlled Vital Signs Assessment: post-procedure vital signs reviewed and stable Respiratory status: spontaneous breathing, nonlabored ventilation, respiratory function stable and patient connected to nasal cannula oxygen Cardiovascular status: stable and blood pressure returned to baseline Postop Assessment: no apparent nausea or vomiting Anesthetic complications: no    Last Vitals:  Vitals:   06/10/19 1000 06/10/19 1020  BP: 111/60 113/60  Pulse: 71 68  Resp: 18 14  Temp:  36.7 C  SpO2: 100% 96%    Last Pain:  Vitals:   06/10/19 1020  TempSrc:   PainSc: 0-No pain                 Erique Kaser DAVID

## 2019-06-10 NOTE — H&P (Signed)
  Claire Morgan is an 68 y.o. female.   Chief Complaint: trigger right thumb and ring finger HPI: Claire Morgan is a 68 year old female with a history of triggering of her right thumb in right ring finger. She has had two injections to the ring finger and one injection to the thumb both continue to catch causing her problems. She is not complaining of pain or discomfort that the present time. She states that the altering occurs on a persistent basis. She has a history of diabetes a history of thyroid problems with redness or gal. Her family history is negative for each of these.  Past Medical History:  Diagnosis Date  . Anemia   . Coronary artery disease   . Diabetes mellitus without complication ([place])   . GERD (gastroesophageal reflux disease)   . Hypertension   . Myocardial infarction ([place]) 09/26/2004  . PONV (postoperative nausea and vomiting)     Past Surgical History:  Procedure Laterality Date  . ABDOMINAL HYSTERECTOMY  1995  . CARDIAC CATHETERIZATION    . CATARACT EXTRACTION Bilateral 2019  . CHOLECYSTECTOMY    . CORONARY ANGIOPLASTY  2005    History reviewed. No pertinent family history. Social History:  reports that she quit smoking about 14 years ago. Her smoking use included cigarettes. She has never used smokeless tobacco. She reports that she does not drink alcohol. No history on file for drug.  Allergies:  Allergies  Allergen Reactions  . Contrast Media [Iodinated Diagnostic Agents] Rash  . Shellfish Allergy Rash  . Eggs Or Egg-Derived Products Nausea Only  . Tyloxapol     No medications prior to admission.    No results found for this or any previous visit (from the past 48 hour(s)).  No results found.   Pertinent items are noted in HPI.  Height 5\' 2"  (1.575 m), weight 95.3 kg.  General appearance: alert, cooperative and appears stated age Head: Normocephalic, without obvious abnormality Neck: no JVD Resp: clear to auscultation bilaterally Cardio: regular  rate and rhythm, S1, S2 normal, no murmur, click, rub or gallop GI: soft, non-tender; bowel sounds normal; no masses,  no organomegaly Extremities: catching  right thumb and ring finger Pulses: 2+ and symmetric Skin: Skin color, texture, turgor normal. No rashes or lesions Neurologic: Grossly normal Incision/Wound: na  Assessment/Plan Assessment: trigger right thumb and ring finger Plan: release of A1 pulley thumb and ring fingers right-hand. Pre-. Postoperative course been discussed along with recent convocation. She is aware that there is no guarantee to the surgery the possibility of infection recurrence and regard reserves tendons in complete relief of symptoms and dystrophy. This is schedules as an outpatient under regional anesthesia.  Daryll Brod 06/10/2019, 5:47 AM

## 2019-06-11 ENCOUNTER — Encounter (HOSPITAL_BASED_OUTPATIENT_CLINIC_OR_DEPARTMENT_OTHER): Payer: Self-pay | Admitting: Orthopedic Surgery

## 2019-09-08 DIAGNOSIS — E119 Type 2 diabetes mellitus without complications: Secondary | ICD-10-CM | POA: Insufficient documentation

## 2019-09-09 ENCOUNTER — Ambulatory Visit: Payer: Self-pay | Admitting: Cardiology

## 2019-10-08 ENCOUNTER — Ambulatory Visit: Payer: Self-pay | Admitting: Cardiology

## 2019-10-14 ENCOUNTER — Other Ambulatory Visit: Payer: Self-pay

## 2019-10-14 ENCOUNTER — Ambulatory Visit: Payer: Medicare Other | Admitting: Cardiology

## 2019-10-14 ENCOUNTER — Encounter: Payer: Self-pay | Admitting: Cardiology

## 2019-10-14 VITALS — BP 148/74 | HR 74 | Ht 62.0 in | Wt 205.5 lb

## 2019-10-14 DIAGNOSIS — E7800 Pure hypercholesterolemia, unspecified: Secondary | ICD-10-CM

## 2019-10-14 DIAGNOSIS — I10 Essential (primary) hypertension: Secondary | ICD-10-CM

## 2019-10-14 DIAGNOSIS — D6959 Other secondary thrombocytopenia: Secondary | ICD-10-CM | POA: Diagnosis not present

## 2019-10-14 DIAGNOSIS — I25118 Atherosclerotic heart disease of native coronary artery with other forms of angina pectoris: Secondary | ICD-10-CM | POA: Diagnosis not present

## 2019-10-14 MED ORDER — NITROGLYCERIN 0.4 MG SL SUBL: 0.4000 mg | SUBLINGUAL_TABLET | SUBLINGUAL | 3 refills | Status: DC | PRN

## 2019-10-14 NOTE — Progress Notes (Signed)
Primary Physician/Referring:  Vicenta Aly, FNP  Patient ID: Claire Morgan, female    DOB: 12/15/1951, 68 y.o.   MRN: [id number]  Chief Complaint  Patient presents with  . Coronary Artery Disease    1 year follow up  . Follow-up   HPI:    Claire Morgan  is a 68 y.o. Caucasian female with history of diabetes mellitus which is controlled, hypertension, hyperlipidemia, CAD S/P myocardial infarction in 2005 and stent 2, 2.5 x 23 mm mid LAD stent for an MI and mid circumflex 2.5 x 23 mm Cypher DES. She has not had recurrence of angina since. This is her annual visit, states that she was found to have thrombocytopenia recently.  No bleeding diathesis.  She admits to eating excessively during the Covid 19 and has gained weight.  Denies chest pain or shortness of breath except for mild chronic dyspnea.  No leg edema, no PND or orthopnea.  Past Medical History:  Diagnosis Date  . Anemia   . Coronary artery disease   . Diabetes mellitus without complication ([place])   . GERD (gastroesophageal reflux disease)   . Hypertension   . Myocardial infarction ([place]) 09/26/2004  . PONV (postoperative nausea and vomiting)    Past Surgical History:  Procedure Laterality Date  . ABDOMINAL HYSTERECTOMY  1995  . CARDIAC CATHETERIZATION    . CATARACT EXTRACTION Bilateral 2019  . CHOLECYSTECTOMY    . CORONARY ANGIOPLASTY  2005  . TRIGGER FINGER RELEASE Right 06/10/2019   Procedure: RELEASE TRIGGER FINGER/A-1 PULLEY RIGHT THUMB AND RING;  Surgeon: Daryll Brod, MD;  Location: [facility];  Service: Orthopedics;  Laterality: Right;  FAB   Social History   Socioeconomic History  . Marital status: Married    Spouse name: Not on file  . Number of children: Not on file  . Years of education: Not on file  . Highest education level: Not on file  Occupational History  . Not on file  Social Needs  . Financial resource strain: Not on file  . Food insecurity    Worry: Not on file   Inability: Not on file  . Transportation needs    Medical: Not on file    Non-medical: Not on file  Tobacco Use  . Smoking status: Former Smoker    Types: Cigarettes    Quit date: 09/27/2004    Years since quitting: 15.0  . Smokeless tobacco: Never Used  Substance and Sexual Activity  . Alcohol use: Never    Frequency: Never  . Drug use: Not on file  . Sexual activity: Not on file  Lifestyle  . Physical activity    Days per week: Not on file    Minutes per session: Not on file  . Stress: Not on file  Relationships  . Social Herbalist on phone: Not on file    Gets together: Not on file    Attends religious service: Not on file    Active member of club or organization: Not on file    Attends meetings of clubs or organizations: Not on file    Relationship status: Not on file  . Intimate partner violence    Fear of current or ex partner: Not on file    Emotionally abused: Not on file    Physically abused: Not on file    Forced sexual activity: Not on file  Other Topics Concern  . Not on file  Social History Narrative  . Not  on file   ROS  Review of Systems  Constitution: Negative for chills, decreased appetite, malaise/fatigue and weight gain.  Cardiovascular: Negative for dyspnea on exertion, leg swelling and syncope.  Endocrine: Negative for cold intolerance.  Hematologic/Lymphatic: Does not bruise/bleed easily.  Musculoskeletal: Positive for joint pain (hands and hip). Negative for joint swelling.  Gastrointestinal: Negative for abdominal pain, anorexia, change in bowel habit, hematochezia and melena.  Neurological: Negative for headaches and light-headedness.  Psychiatric/Behavioral: Negative for depression and substance abuse.  All other systems reviewed and are negative.  Objective   Vitals with BMI 10/14/2019 06/10/2019 06/10/2019  Height 5' 2"  - -  Weight 205 lbs 8 oz - -  BMI 08.65 - -  Systolic 784 696 295  Diastolic 74 60 60  Pulse 74 68 71     Blood pressure (!) 148/74, pulse 74, height 5' 2"  (1.575 m), weight 205 lb 8 oz (93.2 kg), SpO2 97 %. Body mass index is 37.59 kg/m.   Physical Exam  Constitutional:  She is moderately built and moderately obese in no acute distress.  HENT:  Head: Atraumatic.  Eyes: Conjunctivae are normal.  Neck: Neck supple. No JVD present. No thyromegaly present.  Cardiovascular: Normal rate, regular rhythm, normal heart sounds, intact distal pulses and normal pulses. Exam reveals no gallop.  No murmur heard. There is no leg edema.  No JVD.  Pulmonary/Chest: Effort normal and breath sounds normal.  Abdominal: Soft. Bowel sounds are normal.  Musculoskeletal: Normal range of motion.  Neurological: She is alert.  Skin: Skin is warm and dry.  Psychiatric: She has a normal mood and affect.   Radiology: No results found.  Laboratory examination:   Recent Labs    06/07/19 1100  NA 142  K 5.0  CL 106  CO2 28  GLUCOSE 105*  BUN 13  CREATININE 0.65  CALCIUM 9.5  GFRNONAA >60  GFRAA >60   CMP Latest Ref Rng & Units 06/07/2019  Glucose 70 - 99 mg/dL 105(H)  BUN 8 - 23 mg/dL 13  Creatinine 0.44 - 1.00 mg/dL 0.65  Sodium 135 - 145 mmol/L 142  Potassium 3.5 - 5.1 mmol/L 5.0  Chloride 98 - 111 mmol/L 106  CO2 22 - 32 mmol/L 28  Calcium 8.9 - 10.3 mg/dL 9.5   Hemoglobin A1c 6.0 (A) 11/05/2018 .  TSH 2.200 04/29/2017   Comprehensive Metabolic PanelResulted: 2/84/1324 1:35 AM [facility] Component Name Value Ref Range  Glucose 127 (H) 65 - 99 mg/dL  BUN 6 (L) 8 - 27 mg/dL  Creatinine, Serum 0.61 0.57 - 1 mg/dL  eGFR If NonAfrican American 93 >59 mL/min/1.73  eGFR If African American 107 >59 mL/min/1.73  BUN/Creatinine Ratio 10 (L) 12 - 28   Sodium 142 134 - 144 mmol/L  Potassium 4.3 3.5 - 5.2 mmol/L  Chloride 103 96 - 106 mmol/L  CO2 23 20 - 29 mmol/L  CALCIUM 9.4 8.7 - 10.3 mg/dL  Total Protein 6.7 6 - 8.5 g/dL  Albumin, Serum 4.7 3.8 - 4.8 g/dL  Globulin, Total 2.0 1.5 - 4.5  g/dL  Albumin/Globulin Ratio 2.4 (H) 1.2 - 2.2   Total Bilirubin 0.6 0 - 1.2 mg/dL  Alkaline Phosphatase 66 39 - 117 IU/L  AST 45 (H) 0 - 40 IU/L  ALT (SGPT) 58 (H) 0 - 32 IU/L   CBC And DifferentialResulted: 09/11/2019 1:35 AM [facility] Component Name Value Ref Range  WBC 5.4 3.4 - 10.8 x10E3/uL  RBC 4.55 3.77 - 5.28 x10E6/uL  Hemoglobin 13.4 11.1 - 15.9 g/dL  Hematocrit 41.2 34 - 46.6 %  MCV 91 79 - 97 fL  MCH 29.5 26.6 - 33 pg  MCHC 32.5 31.5 - 35.7 g/dL  RDW 14.3 11.7 - 15.4 %  Platelet Count 139 (L) 150 - 450 x10E3/uL   Lipid PanelResulted: 11/06/2018 7:36 AM [facility] Component Name Value Ref Range  Cholesterol, Total 119 100 - 199 mg/dL  Triglycerides 108 0 - 149 mg/dL  HDL 46 >39 mg/dL  VLDL Cholesterol Cal 22 5 - 40 mg/dL  LDL 51 0 - 99 mg/dL   No flowsheet data found. Lipid Panel  No results found for: CHOL, TRIG, HDL, CHOLHDL, VLDL, LDLCALC, LDLDIRECT HEMOGLOBIN A1C No results found for: HGBA1C, MPG TSH No results for input(s): TSH in the last 8760 hours. Medications and allergies   Allergies  Allergen Reactions  . Contrast Media [Iodinated Diagnostic Agents] Rash  . Shellfish Allergy Rash  . Eggs Or Egg-Derived Products Nausea Only  . Fish Oil   . Tyloxapol     "makes me crazy"      Prior to Admission medications   Medication Sig Start Date End Date Taking? Authorizing Provider  aspirin 81 MG chewable tablet Chew 81 mg by mouth daily.    [provider]  atorvastatin (LIPITOR) 80 MG tablet Take 80 mg by mouth daily.    [provider]  B Complex-C-Folic Acid (HM SUPER VITAMIN B COMPLEX/C) TABS Take by mouth.    [provider]  calcium-vitamin D (OSCAL WITH D) 500-200 MG-UNIT tablet Take 1 tablet by mouth.    [provider]  ferrous sulfate 324 MG TBEC Take 324 mg by mouth.    [provider]  ferrous sulfate 324 MG TBEC Take 324 mg by mouth 2 (two) times daily.    [provider]  folic  acid (FOLVITE) 235 MCG tablet Take 800 mcg by mouth daily.    [provider]  glimepiride (AMARYL) 2 MG tablet Take 2 mg by mouth daily with breakfast.    [provider]  metFORMIN (GLUCOPHAGE) 500 MG tablet Take 1,000 mg by mouth daily with breakfast.    [provider]  metoprolol succinate (TOPROL-XL) 100 MG 24 hr tablet Take 100 mg by mouth daily. Take with or immediately following a meal.    [provider]  Misc Natural Products (LUTEIN 20 PO) Take by mouth 1 day or 1 dose.    [provider]  Multiple Vitamins-Minerals (PRESERVISION AREDS 2 PO) Take by mouth 2 (two) times a day.    [provider]  omeprazole (PRILOSEC) 20 MG capsule Take 20 mg by mouth daily.    [provider]  ramipril (ALTACE) 10 MG capsule Take 10 mg by mouth daily.    [provider]  Specialty Vitamins Products (MAGNESIUM, AMINO ACID CHELATE,) 133 MG tablet Take 1 tablet by mouth 2 (two) times daily.    [provider]  spironolactone (ALDACTONE) 25 MG tablet Take 25 mg by mouth daily.    [provider]  traMADol (ULTRAM) 50 MG tablet Take 1 tablet (50 mg total) by mouth every 6 (six) hours as needed. 06/10/19   Daryll Brod, MD     Current Outpatient Medications  Medication Instructions  . glimepiride (AMARYL) 2 mg, Oral, Daily with breakfast  . metFORMIN (GLUCOPHAGE) 1,000 mg, Oral, Daily with breakfast  . metoprolol succinate (TOPROL-XL) 100 mg, Oral, Daily, Take with or immediately following a meal.   .  nitroGLYCERIN (NITROSTAT) 0.4 mg, Sublingual, Every 5 min PRN  . omeprazole (PRILOSEC) 20 mg, Oral, Daily  . ramipril (ALTACE) 10 mg, Oral, Daily  . spironolactone (ALDACTONE) 25 mg, Oral, Daily    Cardiac Studies:   Coronary Angiography  2023/10/09: 2.5x23 mid LAD for MI, Mid Circumflex 2.5x23 Cypher DES staged.  Echo 10/10/11 Low Normal LVEF. Trace MR and TR.  Lexiscan stress 04/17/12: Inf wall scar, no significant  ischemia, small apical lateral moderate ischemia. EF 68%. low risk and no significant change compared to Nuclear stress 10/11/11.   Assessment     ICD-10-CM   1. Coronary artery disease of native artery of native heart with stable angina pectoris ([place])  I25.118 EKG 12-Lead  2. Essential hypertension  I10 CMP14+EGFR  3. Pure hypercholesterolemia  E78.00   4. Thrombocytopenia due to drugs  D69.59 CBC   T50.905A   Hold Lipitor Will hold for 3 weeks for thrombocytopenia and check CBC   EKG 10/14/2019: Normal sinus rhythm at rate of 64 bpm, left axis deviation, left anterior fascicular block.  Right bundle branch block.  T wave abnormality, cannot exclude anteroseptal ischemia. No significant change from   09/08/2018   Recommendations:   Claire Morgan with known coronary disease follow-up, she was recently found to have thrombocytopenia with a past 4-6 months, I reviewed her labs from her PCP, I suspect decompensated and is related to atorvastatin.  I will forward atorvastatin for now, will repeat CBC in 4 weeks.  I'll also repeat CMP as she is abnormal LFTs, which is probably related to fatty liver.  Weight loss was again discussed with the patient.  From cardiac standpoint otherwise she is doing well, no change in the EKG, I'll like to see her back in 4-6 weeks for follow-up.  Diabetes is well controlled, although blood pressure is slightly elevated, we'll adjust her medications on her next office visit.  She plans to make lifestyle changes as well.  In view of abnormal LFTs I did not want to make any changes drastically for now.  Adrian Prows, MD, [facility] 10/14/2019, 9:54 AM [facility] [place] Pager: [phone] Office: [phone] If no answer Cell [phone]

## 2019-11-09 LAB — CMP14+EGFR
ALT: 87 IU/L — ABNORMAL HIGH (ref 0–32)
AST: 56 IU/L — ABNORMAL HIGH (ref 0–40)
Albumin/Globulin Ratio: 1.8 (ref 1.2–2.2)
Albumin: 4.4 g/dL (ref 3.8–4.8)
Alkaline Phosphatase: 72 IU/L (ref 39–117)
BUN/Creatinine Ratio: 15 (ref 12–28)
BUN: 10 mg/dL (ref 8–27)
Bilirubin Total: 0.4 mg/dL (ref 0.0–1.2)
CO2: 24 mmol/L (ref 20–29)
Calcium: 9.8 mg/dL (ref 8.7–10.3)
Chloride: 102 mmol/L (ref 96–106)
Creatinine, Ser: 0.67 mg/dL (ref 0.57–1.00)
GFR calc Af Amer: 103 mL/min/{1.73_m2} (ref >59)
GFR calc non Af Amer: 89 mL/min/{1.73_m2} (ref >59)
Globulin, Total: 2.5 g/dL (ref 1.5–4.5)
Glucose: 106 mg/dL — ABNORMAL HIGH (ref 65–99)
Potassium: 4.7 mmol/L (ref 3.5–5.2)
Sodium: 141 mmol/L (ref 134–144)
Total Protein: 6.9 g/dL (ref 6.0–8.5)

## 2019-11-09 LAB — CBC
Hematocrit: 43.3 % (ref 34.0–46.6)
Hemoglobin: 14 g/dL (ref 11.1–15.9)
MCH: 29.4 pg (ref 26.6–33.0)
MCHC: 32.3 g/dL (ref 31.5–35.7)
MCV: 91 fL (ref 79–97)
Platelets: 142 10*3/uL — ABNORMAL LOW (ref 150–450)
RBC: 4.76 x10E6/uL (ref 3.77–5.28)
RDW: 14 % (ref 11.7–15.4)
WBC: 5.2 10*3/uL (ref 3.4–10.8)

## 2019-11-19 ENCOUNTER — Other Ambulatory Visit: Payer: Self-pay

## 2019-11-19 ENCOUNTER — Ambulatory Visit (INDEPENDENT_AMBULATORY_CARE_PROVIDER_SITE_OTHER): Payer: Medicare Other | Admitting: Cardiology

## 2019-11-19 ENCOUNTER — Encounter: Payer: Self-pay | Admitting: Cardiology

## 2019-11-19 VITALS — BP 144/67 | HR 76 | Temp 97.9°F | Ht 62.0 in | Wt 200.7 lb

## 2019-11-19 DIAGNOSIS — I10 Essential (primary) hypertension: Secondary | ICD-10-CM | POA: Diagnosis not present

## 2019-11-19 DIAGNOSIS — D6959 Other secondary thrombocytopenia: Secondary | ICD-10-CM

## 2019-11-19 DIAGNOSIS — I25118 Atherosclerotic heart disease of native coronary artery with other forms of angina pectoris: Secondary | ICD-10-CM | POA: Diagnosis not present

## 2019-11-19 DIAGNOSIS — R7989 Other specified abnormal findings of blood chemistry: Secondary | ICD-10-CM

## 2019-11-19 DIAGNOSIS — E7800 Pure hypercholesterolemia, unspecified: Secondary | ICD-10-CM | POA: Diagnosis not present

## 2019-11-19 DIAGNOSIS — R945 Abnormal results of liver function studies: Secondary | ICD-10-CM

## 2019-11-19 DIAGNOSIS — T50905A Adverse effect of unspecified drugs, medicaments and biological substances, initial encounter: Secondary | ICD-10-CM

## 2019-11-19 MED ORDER — SPIRONOLACTONE-HCTZ 25-25 MG PO TABS: 1.0000 | ORAL_TABLET | ORAL | 3 refills | Status: DC

## 2019-11-19 MED ORDER — ATORVASTATIN CALCIUM 80 MG PO TABS: 80.0000 mg | ORAL_TABLET | Freq: Every day | ORAL | 3 refills | Status: AC

## 2019-11-19 NOTE — Progress Notes (Signed)
Primary Physician/Referring:  Vicenta Aly, FNP  Patient ID: Claire Morgan, female    DOB: [birth year], 68 y.o.   MRN: [id number]  Chief Complaint  Patient presents with  . Coronary Artery Disease    follow up   HPI:    Claire Morgan  is a 68 y.o. Caucasian female with history of diabetes mellitus which is controlled, hypertension, hyperlipidemia, CAD S/P myocardial infarction in 2005 and stent 2, 2.5 x 23 mm mid LAD stent for an MI and mid circumflex 2.5 x 23 mm Cypher DES. She has not had recurrence of angina since. This is her annual visit, states that she was found to have thrombocytopenia recently.  No bleeding diathesis.  She admits to eating excessively during the Covid 19 and has gained weight.  Denies chest pain or shortness of breath except for mild chronic dyspnea.  No leg edema, no PND or orthopnea.  Past Medical History:  Diagnosis Date  . Anemia   . Coronary artery disease   . Diabetes mellitus without complication ([place])   . GERD (gastroesophageal reflux disease)   . Hypertension   . Myocardial infarction ([place]) 09/26/2004  . PONV (postoperative nausea and vomiting)    Past Surgical History:  Procedure Laterality Date  . ABDOMINAL HYSTERECTOMY  1995  . CARDIAC CATHETERIZATION    . CATARACT EXTRACTION Bilateral 2019  . CHOLECYSTECTOMY    . CORONARY ANGIOPLASTY  2005  . TRIGGER FINGER RELEASE Right 06/10/2019   Procedure: RELEASE TRIGGER FINGER/A-1 PULLEY RIGHT THUMB AND RING;  Surgeon: Daryll Brod, MD;  Location: [place];  Service: Orthopedics;  Laterality: Right;  FAB   Social History   Socioeconomic History  . Marital status: Married    Spouse name: Not on file  . Number of children: Not on file  . Years of education: Not on file  . Highest education level: Not on file  Occupational History  . Not on file  Social Needs  . Financial resource strain: Not on file  . Food insecurity    Worry: Not on file    Inability: Not on file  .  Transportation needs    Medical: Not on file    Non-medical: Not on file  Tobacco Use  . Smoking status: Former Smoker    Types: Cigarettes    Quit date: 09/27/2004    Years since quitting: 15.1  . Smokeless tobacco: Never Used  Substance and Sexual Activity  . Alcohol use: Never    Frequency: Never  . Drug use: Not on file  . Sexual activity: Not on file  Lifestyle  . Physical activity    Days per week: Not on file    Minutes per session: Not on file  . Stress: Not on file  Relationships  . Social Herbalist on phone: Not on file    Gets together: Not on file    Attends religious service: Not on file    Active member of club or organization: Not on file    Attends meetings of clubs or organizations: Not on file    Relationship status: Not on file  . Intimate partner violence    Fear of current or ex partner: Not on file    Emotionally abused: Not on file    Physically abused: Not on file    Forced sexual activity: Not on file  Other Topics Concern  . Not on file  Social History Narrative  . Not on file  ROS  Review of Systems  Constitution: Negative for chills, decreased appetite, malaise/fatigue and weight gain.  Cardiovascular: Negative for dyspnea on exertion, leg swelling and syncope.  Endocrine: Negative for cold intolerance.  Hematologic/Lymphatic: Does not bruise/bleed easily.  Musculoskeletal: Positive for joint pain (hands and hip). Negative for joint swelling.  Gastrointestinal: Negative for abdominal pain, anorexia, change in bowel habit, hematochezia and melena.  Neurological: Negative for headaches and light-headedness.  Psychiatric/Behavioral: Negative for depression and substance abuse.  All other systems reviewed and are negative.  Objective   Vitals with BMI 11/19/2019 10/14/2019 06/10/2019  Height 5' 2"  5' 2"  -  Weight 200 lbs 11 oz 205 lbs 8 oz -  BMI 92.3 30.07 -  Systolic 622 633 354  Diastolic 67 74 60  Pulse 76 74 68    Blood  pressure (!) 144/67, pulse 76, temperature 97.9 F (36.6 C), height 5' 2"  (1.575 m), weight 200 lb 11.2 oz (91 kg), SpO2 97 %. Body mass index is 36.71 kg/m.   Physical Exam  Constitutional:  She is moderately built and moderately obese in no acute distress.  HENT:  Head: Atraumatic.  Eyes: Conjunctivae are normal.  Neck: Neck supple. No JVD present. No thyromegaly present.  Cardiovascular: Normal rate, regular rhythm, normal heart sounds, intact distal pulses and normal pulses. Exam reveals no gallop.  No murmur heard. There is no leg edema.  No JVD.  Pulmonary/Chest: Effort normal and breath sounds normal.  Abdominal: Soft. Bowel sounds are normal.  Musculoskeletal: Normal range of motion.  Neurological: She is alert.  Skin: Skin is warm and dry.  Psychiatric: She has a normal mood and affect.   Radiology: No results found.  Laboratory examination:   Recent Labs    06/07/19 1100 11/08/19 0859  NA 142 141  K 5.0 4.7  CL 106 102  CO2 28 24  GLUCOSE 105* 106*  BUN 13 10  CREATININE 0.65 0.67  CALCIUM 9.5 9.8  GFRNONAA >60 89  GFRAA >60 103   CMP Latest Ref Rng & Units 11/08/2019 06/07/2019  Glucose 65 - 99 mg/dL 106(H) 105(H)  BUN 8 - 27 mg/dL 10 13  Creatinine 0.57 - 1.00 mg/dL 0.67 0.65  Sodium 134 - 144 mmol/L 141 142  Potassium 3.5 - 5.2 mmol/L 4.7 5.0  Chloride 96 - 106 mmol/L 102 106  CO2 20 - 29 mmol/L 24 28  Calcium 8.7 - 10.3 mg/dL 9.8 9.5  Total Protein 6.0 - 8.5 g/dL 6.9 -  Total Bilirubin 0.0 - 1.2 mg/dL 0.4 -  Alkaline Phos 39 - 117 IU/L 72 -  AST 0 - 40 IU/L 56(H) -  ALT 0 - 32 IU/L 87(H) -   Hemoglobin A1c 6.0 (A) 11/05/2018 .  TSH 2.200 04/29/2017   Comprehensive Metabolic PanelResulted: 5/62/5638 1:35 AM [facility] Component Name Value Ref Range  Glucose 127 (H) 65 - 99 mg/dL  BUN 6 (L) 8 - 27 mg/dL  Creatinine, Serum 0.61 0.57 - 1 mg/dL  eGFR If NonAfrican American 93 >59 mL/min/1.73  eGFR If African American 107 >59 mL/min/1.73   BUN/Creatinine Ratio 10 (L) 12 - 28   Sodium 142 134 - 144 mmol/L  Potassium 4.3 3.5 - 5.2 mmol/L  Chloride 103 96 - 106 mmol/L  CO2 23 20 - 29 mmol/L  CALCIUM 9.4 8.7 - 10.3 mg/dL  Total Protein 6.7 6 - 8.5 g/dL  Albumin, Serum 4.7 3.8 - 4.8 g/dL  Globulin, Total 2.0 1.5 - 4.5 g/dL  Albumin/Globulin Ratio  2.4 (H) 1.2 - 2.2   Total Bilirubin 0.6 0 - 1.2 mg/dL  Alkaline Phosphatase 66 39 - 117 IU/L  AST 45 (H) 0 - 40 IU/L  ALT (SGPT) 58 (H) 0 - 32 IU/L   CBC And DifferentialResulted: 09/11/2019 1:35 AM [facility] Component Name Value Ref Range  WBC 5.4 3.4 - 10.8 x10E3/uL  RBC 4.55 3.77 - 5.28 x10E6/uL  Hemoglobin 13.4 11.1 - 15.9 g/dL  Hematocrit 41.2 34 - 46.6 %  MCV 91 79 - 97 fL  MCH 29.5 26.6 - 33 pg  MCHC 32.5 31.5 - 35.7 g/dL  RDW 14.3 11.7 - 15.4 %  Platelet Count 139 (L) 150 - 450 x10E3/uL   Lipid PanelResulted: 11/06/2018 7:36 AM [facility] Component Name Value Ref Range  Cholesterol, Total 119 100 - 199 mg/dL  Triglycerides 108 0 - 149 mg/dL  HDL 46 >39 mg/dL  VLDL Cholesterol Cal 22 5 - 40 mg/dL  LDL 51 0 - 99 mg/dL   CBC Latest Ref Rng & Units 11/08/2019  WBC 3.4 - 10.8 x10E3/uL 5.2  Hemoglobin 11.1 - 15.9 g/dL 14.0  Hematocrit 34.0 - 46.6 % 43.3  Platelets 150 - 450 x10E3/uL 142(L)   Lipid Panel  No results found for: CHOL, TRIG, HDL, CHOLHDL, VLDL, LDLCALC, LDLDIRECT HEMOGLOBIN A1C No results found for: HGBA1C, MPG TSH No results for input(s): TSH in the last 8760 hours. Medications and allergies   Allergies  Allergen Reactions  . Contrast Media [Iodinated Diagnostic Agents] Rash  . Shellfish Allergy Rash  . Eggs Or Egg-Derived Products Nausea Only  . Fish Oil   . Tyloxapol     "makes me crazy"      Prior to Admission medications   Medication Sig Start Date End Date Taking? Authorizing Provider  aspirin 81 MG chewable tablet Chew 81 mg by mouth daily.    [provider]  atorvastatin (LIPITOR) 80 MG tablet Take 80 mg by mouth  daily.    [provider]  B Complex-C-Folic Acid (HM SUPER VITAMIN B COMPLEX/C) TABS Take by mouth.    [provider]  calcium-vitamin D (OSCAL WITH D) 500-200 MG-UNIT tablet Take 1 tablet by mouth.    [provider]  ferrous sulfate 324 MG TBEC Take 324 mg by mouth.    [provider]  ferrous sulfate 324 MG TBEC Take 324 mg by mouth 2 (two) times daily.    [provider]  folic acid (FOLVITE) 825 MCG tablet Take 800 mcg by mouth daily.    [provider]  glimepiride (AMARYL) 2 MG tablet Take 2 mg by mouth daily with breakfast.    [provider]  metFORMIN (GLUCOPHAGE) 500 MG tablet Take 1,000 mg by mouth daily with breakfast.    [provider]  metoprolol succinate (TOPROL-XL) 100 MG 24 hr tablet Take 100 mg by mouth daily. Take with or immediately following a meal.    [provider]  Misc Natural Products (LUTEIN 20 PO) Take by mouth 1 day or 1 dose.    [provider]  Multiple Vitamins-Minerals (PRESERVISION AREDS 2 PO) Take by mouth 2 (two) times a day.    [provider]  omeprazole (PRILOSEC) 20 MG capsule Take 20 mg by mouth daily.    [provider]  ramipril (ALTACE) 10 MG capsule Take 10 mg by mouth daily.    [provider]  Specialty Vitamins Products (MAGNESIUM, AMINO ACID CHELATE,) 133 MG tablet Take 1 tablet by  mouth 2 (two) times daily.    [provider]  spironolactone (ALDACTONE) 25 MG tablet Take 25 mg by mouth daily.    [provider]  traMADol (ULTRAM) 50 MG tablet Take 1 tablet (50 mg total) by mouth every 6 (six) hours as needed. 06/10/19   Daryll Brod, MD     Current Outpatient Medications  Medication Instructions  . atorvastatin (LIPITOR) 80 mg, Oral, Daily  . glimepiride (AMARYL) 2 mg, Oral, Daily with breakfast  . metFORMIN (GLUCOPHAGE) 1,000 mg, Oral, Daily with breakfast  . metoprolol succinate (TOPROL-XL) 100 mg, Oral,  Daily, Take with or immediately following a meal.   . nitroGLYCERIN (NITROSTAT) 0.4 mg, Sublingual, Every 5 min PRN  . omeprazole (PRILOSEC) 20 mg, Oral, Daily  . ramipril (ALTACE) 10 mg, Oral, Daily  . spironolactone (ALDACTONE) 25 mg, Oral, Daily  . spironolactone-hydrochlorothiazide (ALDACTAZIDE) 25-25 MG tablet 1 tablet, Oral, BH-each morning    Cardiac Studies:   Coronary Angiography  2023/10/21: 2.5x23 mid LAD for MI, Mid Circumflex 2.5x23 Cypher DES staged.  Echo 10/10/11 Low Normal LVEF. Trace MR and TR.  Lexiscan stress 04/17/12: Inf wall scar, no significant ischemia, small apical lateral moderate ischemia. EF 68%. low risk and no significant change compared to Nuclear stress 10/11/11.   Assessment     ICD-10-CM   1. Coronary artery disease of native artery of native heart with stable angina pectoris ([place])  I25.118   2. Essential hypertension  I10 spironolactone-hydrochlorothiazide (ALDACTAZIDE) 25-25 MG tablet  3. Pure hypercholesterolemia  E78.00 atorvastatin (LIPITOR) 80 MG tablet  4. Thrombocytopenia due to drugs  D69.59    T50.905A   5. Abnormal LFTs  R94.5   Hold Lipitor Will hold for 3 weeks for thrombocytopenia and check CBC   EKG 10/14/2019: Normal sinus rhythm at rate of 64 bpm, left axis deviation, left anterior fascicular block.  Right bundle branch block.  T wave abnormality, cannot exclude anteroseptal ischemia. No significant change from   09/08/2018   Recommendations:   Ms. Decoursey with known coronary disease follow-up, she was recently found to have thrombocytopenia with a past 4-6 months.  On her last office visit, due to elevated LFTs and also borderline low platelets, I had discontinued atorvastatin and repeated the labs 6 weeks later.  There is still no change in LFT elevation and also platelet count.  Hence advised her to restart the atorvastatin which she was tolerating for many years at 80 mg once a day.  Blood pressure continues to remain elevated, both  at home and in office blood pressure was 140 mmHg plus.  I have changed pain Aldactone to Aldactazide, she has an appointment to see Vicenta Aly, NP in 3-4 weeks, she can obtain a BMP at that point.  She probably may need GI consultation.  From cardiac standpoint otherwise she is doing well, no change in the EKG, I'll like to see her back in 4-6 weeks for follow-up.  Diabetes is well controlled. OV in 1 year.   Adrian Prows, MD, [facility] 11/19/2019, 9:25 AM [facility]. [place] Pager: [phone] Office: [phone] If no answer Cell [phone]

## 2020-11-09 ENCOUNTER — Other Ambulatory Visit: Payer: Self-pay | Admitting: Cardiology

## 2020-11-09 DIAGNOSIS — I10 Essential (primary) hypertension: Secondary | ICD-10-CM

## 2020-11-17 ENCOUNTER — Ambulatory Visit: Payer: Medicare Other | Admitting: Cardiology

## 2020-11-28 ENCOUNTER — Encounter: Payer: Self-pay | Admitting: Cardiology

## 2020-11-28 ENCOUNTER — Ambulatory Visit: Payer: Medicare Other | Admitting: Cardiology

## 2020-11-28 ENCOUNTER — Other Ambulatory Visit: Payer: Self-pay

## 2020-11-28 VITALS — BP 138/78 | HR 70 | Ht 62.0 in | Wt 191.0 lb

## 2020-11-28 DIAGNOSIS — R7989 Other specified abnormal findings of blood chemistry: Secondary | ICD-10-CM

## 2020-11-28 DIAGNOSIS — E7800 Pure hypercholesterolemia, unspecified: Secondary | ICD-10-CM

## 2020-11-28 DIAGNOSIS — I10 Essential (primary) hypertension: Secondary | ICD-10-CM

## 2020-11-28 DIAGNOSIS — I2510 Atherosclerotic heart disease of native coronary artery without angina pectoris: Secondary | ICD-10-CM

## 2020-11-28 DIAGNOSIS — R945 Abnormal results of liver function studies: Secondary | ICD-10-CM

## 2020-11-28 MED ORDER — SPIRONOLACTONE-HCTZ 25-25 MG PO TABS: ORAL_TABLET | ORAL | 3 refills | Status: DC

## 2020-11-28 NOTE — Progress Notes (Signed)
Primary Physician/Referring:  Vicenta Aly, FNP  Patient ID: Claire Morgan, female    DOB: Nov 07, 1951, 69 y.o.   MRN: [id number]  Chief Complaint  Patient presents with  . Coronary Artery Disease    1 year f/u  . Hypertension  . Follow-up   HPI:    Claire Morgan  is a 69 y.o. Caucasian female with history of diabetes mellitus which is controlled, hypertension, hyperlipidemia, CAD S/P myocardial infarction in 2005 and stent 2, 2.5 x 23 mm mid LAD stent for an MI and mid circumflex 2.5 x 23 mm Cypher DES, chronically elevated LFTs, a year ago had thrombocytopenia, Plavix was discontinued.  Follow-up labs have been stabilized.  She now presents here for annual visit.  She has no specific complaints today, states that since her husband has uncontrolled diabetes, they made lifestyle changes at home and she has lost 10 pounds in the past 6 to 8 weeks.  She denies any exertional chest pain, she has mild chronic dyspnea that has remained stable, no change in her arthritis.  Past Medical History:  Diagnosis Date  . Anemia   . Coronary artery disease   . Diabetes mellitus without complication ([place])   . GERD (gastroesophageal reflux disease)   . Hypertension   . Myocardial infarction ([place]) 09/26/2004  . PONV (postoperative nausea and vomiting)    Past Surgical History:  Procedure Laterality Date  . ABDOMINAL HYSTERECTOMY  1995  . CARDIAC CATHETERIZATION    . CATARACT EXTRACTION Bilateral 2019  . CHOLECYSTECTOMY    . CORONARY ANGIOPLASTY  2005  . TRIGGER FINGER RELEASE Right 06/10/2019   Procedure: RELEASE TRIGGER FINGER/A-1 PULLEY RIGHT THUMB AND RING;  Surgeon: Daryll Brod, MD;  Location: [place];  Service: Orthopedics;  Laterality: Right;  FAB   Social History   Tobacco Use  . Smoking status: Former Smoker    Types: Cigarettes    Quit date: 09/27/2004    Years since quitting: 16.1  . Smokeless tobacco: Never Used  Substance Use Topics  . Alcohol use: Never     Marital Status: Married   ROS  Review of Systems  Cardiovascular: Positive for dyspnea on exertion (stable). Negative for chest pain and leg swelling.  Gastrointestinal: Negative for melena.   Objective   Vitals with BMI 11/28/2020 11/28/2020 11/19/2019  Height - 5' 2"  5' 2"   Weight - 191 lbs 200 lbs 11 oz  BMI - 19.41 74.0  Systolic 814 481 856  Diastolic 78 80 67  Pulse 70 74 76    Blood pressure 138/78, pulse 70, height 5' 2"  (1.575 m), weight 191 lb (86.6 kg), SpO2 99 %. Body mass index is 34.93 kg/m.   Physical Exam Constitutional:      Comments: She is moderately built and moderately obese in no acute distress.  Eyes:     Conjunctiva/sclera: Conjunctivae normal.  Neck:     Thyroid: No thyromegaly.     Vascular: No JVD.  Cardiovascular:     Rate and Rhythm: Normal rate and regular rhythm.     Pulses: Normal pulses and intact distal pulses.     Heart sounds: Normal heart sounds. No murmur heard.  No gallop.      Comments: There is no leg edema.  No JVD. Pulmonary:     Effort: Pulmonary effort is normal.     Breath sounds: Normal breath sounds.  Abdominal:     General: Bowel sounds are normal.     Palpations: Abdomen  is soft.  Musculoskeletal:        General: Normal range of motion.  Skin:    General: Skin is warm and dry.  Neurological:     Mental Status: She is alert.    Radiology: No results found.  Laboratory examination:    External labs:  Labs 06/20/2020:  B12 normal at 442.  A1c 7.0%.  Total cholesterol 126, triglycerides 115, HDL 47, LDL 58.  Hb 13.7/HCT 42.1, platelets 166.  Serum glucose 105 mg, BUN 11, creatinine 0.71, EGFR >0.71.  Potassium 4.4.  AST 48, ALT 56, mildly elevated.  Medications and allergies   Allergies  Allergen Reactions  . Contrast Media [Iodinated Diagnostic Agents] Rash  . Shellfish Allergy Rash  . Eggs Or Egg-Derived Products Nausea Only  . Fish Oil   . Tyloxapol     "makes me crazy"     Current  Outpatient Medications on File Prior to Visit  Medication Sig Dispense Refill  . aspirin 81 MG chewable tablet Chew 81 mg by mouth daily.    Marland Kitchen atorvastatin (LIPITOR) 80 MG tablet Take 1 tablet (80 mg total) by mouth daily. 90 tablet 3  . B Complex-C (B-COMPLEX WITH VITAMIN C) tablet Take 1 tablet by mouth daily.    . calcium-vitamin D (OSCAL WITH D) 500-200 MG-UNIT tablet Take 1 tablet by mouth.    . Cholecalciferol (D3-1000) 25 MCG (1000 UT) capsule Take 1,000 Units by mouth daily.    . folic acid (FOLVITE) 701 MCG tablet Take 400 mcg by mouth daily.    Marland Kitchen glimepiride (AMARYL) 4 MG tablet Take 4 mg by mouth daily with breakfast.    . Lutein 20 MG CAPS Take 1 capsule by mouth daily.    . metFORMIN (GLUCOPHAGE) 500 MG tablet Take 1,000 mg by mouth daily with breakfast.    . metoprolol succinate (TOPROL-XL) 100 MG 24 hr tablet Take 100 mg by mouth daily. Take with or immediately following a meal.    . Multiple Vitamins-Minerals (PRESERVISION AREDS 2+MULTI VIT PO) Take 2 tablets by mouth daily.    . nitroGLYCERIN (NITROSTAT) 0.4 MG SL tablet Place 1 tablet (0.4 mg total) under the tongue every 5 (five) minutes as needed for chest pain. 25 tablet 3  . omeprazole (PRILOSEC) 20 MG capsule Take 20 mg by mouth daily.    . ramipril (ALTACE) 10 MG capsule Take 10 mg by mouth daily.     No current facility-administered medications on file prior to visit.     Cardiac Studies:   Coronary Angiography  2023/10/23: 2.5x23 mid LAD for MI, Mid Circumflex 2.5x23 Cypher DES staged.  Echo 10/10/11 Low Normal LVEF. Trace MR and TR.  Lexiscan stress 04/17/12: Inf wall scar, no significant ischemia, small apical lateral moderate ischemia. EF 68%. low risk and no significant change compared to Nuclear stress 10/11/11.   EKG: EKG 11/28/2020: Normal sinus rhythm at rate of 71 bpm, left atrial enlargement, left axis deviation, left anterior fascicular block.  Right bundle branch block.  Bifascicular block.  Poor R wave  progression, cannot exclude anteroseptal infarct old.  LVH.  No significant change from 10/14/2019.   Assessment     ICD-10-CM   1. Coronary artery disease involving native coronary artery of native heart without angina pectoris  I25.10 EKG 12-Lead  2. Essential hypertension  I10 spironolactone-hydrochlorothiazide (ALDACTAZIDE) 25-25 MG tablet  3. Pure hypercholesterolemia  E78.00   4. Abnormal LFTs  R94.5   Hold Lipitor Will hold for 3 weeks for thrombocytopenia  and check CBC  Meds ordered this encounter  Medications  . spironolactone-hydrochlorothiazide (ALDACTAZIDE) 25-25 MG tablet    Sig: TAKE 1 TABLET BY MOUTH EVERY MORNING    Dispense:  90 tablet    Refill:  3   Medications Discontinued During This Encounter  Medication Reason  . glimepiride (AMARYL) 2 MG tablet Dose change  . spironolactone (ALDACTONE) 25 MG tablet Duplicate  . spironolactone-hydrochlorothiazide (ALDACTAZIDE) 25-25 MG tablet Reorder     Recommendations:   Claire Morgan is a 69 y.o. Caucasian female with history of diabetes mellitus which is controlled, hypertension, hyperlipidemia, CAD S/P myocardial infarction in 2005 and stent 2, 2.5 x 23 mm mid LAD stent for an MI and mid circumflex 2.5 x 23 mm Cypher DES, chronically elevated LFTs, a year ago had thrombocytopenia, Plavix was discontinued.  Follow-up labs have been stabilized.  She now presents here for annual visit.  Her CBC count has now normalized.  Suspect it could have been related to Plavix.  It is only mild.  She remains asymptomatic with no exertional angina, no clinical evidence of heart failure.  Her blood pressure was slightly elevated today upon presentation but normalized.  She has been watching her blood pressure at home and has been in good range.  Also she has lost 10 pounds in weight as her husband is diabetic and she is trying to help him lose weight as well and they both are working together.  Lipids reviewed, under good control as well.   Diabetes is stable.  Renal function is stable.  I refilled her Aldactazide.  I will see her back on annual basis.   Adrian Prows, MD, [facility] 11/28/2020, 1:40 PM Office: [phone] Pager: [phone]

## 2021-11-26 ENCOUNTER — Other Ambulatory Visit: Payer: Self-pay

## 2021-11-26 ENCOUNTER — Ambulatory Visit: Payer: Medicare Other | Admitting: Cardiology

## 2021-11-26 ENCOUNTER — Encounter: Payer: Self-pay | Admitting: Cardiology

## 2021-11-26 VITALS — BP 138/75 | HR 71 | Temp 97.9°F | Resp 16 | Ht 62.0 in | Wt 192.8 lb

## 2021-11-26 DIAGNOSIS — E119 Type 2 diabetes mellitus without complications: Secondary | ICD-10-CM

## 2021-11-26 DIAGNOSIS — E7800 Pure hypercholesterolemia, unspecified: Secondary | ICD-10-CM

## 2021-11-26 DIAGNOSIS — I10 Essential (primary) hypertension: Secondary | ICD-10-CM

## 2021-11-26 DIAGNOSIS — I2510 Atherosclerotic heart disease of native coronary artery without angina pectoris: Secondary | ICD-10-CM

## 2021-11-26 NOTE — Progress Notes (Signed)
Primary Physician/Referring:  Vicenta Aly, FNP  Patient ID: Claire Morgan, female    DOB: 12/02/1951, 70 y.o.   MRN: [id number]  Chief Complaint  Patient presents with   Coronary Artery Disease   Follow-up   Hypertension   Hyperlipidemia   HPI:    Claire Morgan  is a 70 y.o. Caucasian female with history of diabetes mellitus which is controlled, hypertension, hyperlipidemia, CAD S/P myocardial infarction in 2005 and stent 2, 2.5 x 23 mm mid LAD stent for an MI and mid circumflex 2.5 x 23 mm Cypher DES, chronically elevated LFTs. She now presents here for annual visit.  She has no specific complaints today, denies any exertional chest pain, she has mild chronic dyspnea that has remained stable, no change in her arthritis.  Past Medical History:  Diagnosis Date   Anemia    Coronary artery disease    Diabetes mellitus without complication ([place])    GERD (gastroesophageal reflux disease)    Hypertension    Myocardial infarction ([place]) 09/26/2004   PONV (postoperative nausea and vomiting)    Past Surgical History:  Procedure Laterality Date   [place]     CATARACT EXTRACTION Bilateral 2019   CHOLECYSTECTOMY     CORONARY ANGIOPLASTY  2005   TRIGGER FINGER RELEASE Right 06/10/2019   Procedure: RELEASE TRIGGER FINGER/A-1 PULLEY RIGHT THUMB AND RING;  Surgeon: Daryll Brod, MD;  Location: [facility];  Service: Orthopedics;  Laterality: Right;  FAB   Social History   Tobacco Use   Smoking status: Former    Types: Cigarettes    Quit date: 09/27/2004    Years since quitting: 17.1   Smokeless tobacco: Never  Substance Use Topics   Alcohol use: Never   Marital Status: Married   ROS  Review of Systems  Cardiovascular:  Positive for dyspnea on exertion (stable). Negative for chest pain and leg swelling.  Gastrointestinal:  Negative for melena.  Objective   Vitals with BMI 11/26/2021 11/28/2020 11/28/2020  Height  5' 2"  - 5' 2"   Weight 192 lbs 13 oz - 191 lbs  BMI 88.91 - 69.45  Systolic 038 882 800  Diastolic 75 78 80  Pulse 71 70 74    Blood pressure 138/75, pulse 71, temperature 97.9 F (36.6 C), temperature source Temporal, resp. rate 16, height 5' 2"  (1.575 m), weight 192 lb 12.8 oz (87.5 kg), SpO2 97 %. Body mass index is 35.26 kg/m.   Physical Exam Constitutional:      Appearance: She is obese.  Neck:     Vascular: No carotid bruit or JVD.  Cardiovascular:     Rate and Rhythm: Normal rate and regular rhythm.     Pulses: Intact distal pulses.     Heart sounds: Normal heart sounds. No murmur heard.   No gallop.  Pulmonary:     Effort: Pulmonary effort is normal.     Breath sounds: Normal breath sounds.  Abdominal:     General: Bowel sounds are normal.     Palpations: Abdomen is soft.     Comments: [place]  Musculoskeletal:        General: No swelling.   Radiology: No results found.  Laboratory examination:   External labs:  Labs 06/20/2021:  Serum glucose 105 mg, BUN 11, creatinine 0.66, EGFR 94 mL, potassium 4.1.  AST normal, ALT minimally elevated at 37.  Total cholesterol 126, triglycerides 80, HDL 53, LDL 57.  Hb 13.2/HCT 40.6,  platelets 162.  A1c 6.4%.   Medications and allergies   Allergies  Allergen Reactions   Contrast Media [Iodinated Diagnostic Agents] Rash   Shellfish Allergy Rash   Eggs Or Egg-Derived Products Nausea Only   Fish Oil    Tyloxapol     "makes me crazy"     Current Outpatient Medications on File Prior to Visit  Medication Sig Dispense Refill   aspirin 81 MG chewable tablet Chew 81 mg by mouth daily.     atorvastatin (LIPITOR) 80 MG tablet Take 1 tablet (80 mg total) by mouth daily. 90 tablet 3   B Complex-C (B-COMPLEX WITH VITAMIN C) tablet Take 1 tablet by mouth daily.     calcium-vitamin D (OSCAL WITH D) 500-200 MG-UNIT tablet Take 1 tablet by mouth.     Cholecalciferol (D3-1000) 25 MCG (1000 UT) capsule Take 1,000 Units by mouth  daily.     folic acid (FOLVITE) 786 MCG tablet Take 400 mcg by mouth daily.     glimepiride (AMARYL) 4 MG tablet Take 4 mg by mouth daily with breakfast.     Lutein 20 MG CAPS Take 1 capsule by mouth daily.     metFORMIN (GLUCOPHAGE) 500 MG tablet Take 1,000 mg by mouth daily with breakfast.     metoprolol succinate (TOPROL-XL) 100 MG 24 hr tablet Take 100 mg by mouth daily. Take with or immediately following a meal.     Multiple Vitamins-Minerals (PRESERVISION AREDS 2+MULTI VIT PO) Take 2 tablets by mouth daily.     nitroGLYCERIN (NITROSTAT) 0.4 MG SL tablet Place 1 tablet (0.4 mg total) under the tongue every 5 (five) minutes as needed for chest pain. 25 tablet 3   omeprazole (PRILOSEC) 20 MG capsule Take 20 mg by mouth daily.     ramipril (ALTACE) 10 MG capsule Take 10 mg by mouth daily.     spironolactone-hydrochlorothiazide (ALDACTAZIDE) 25-25 MG tablet TAKE 1 TABLET BY MOUTH EVERY MORNING 90 tablet 3   No current facility-administered medications on file prior to visit.     Cardiac Studies:   Coronary Angiography  09-Oct-2023: 2.5x23 mid LAD for MI, Mid Circumflex 2.5x23 Cypher DES staged.  Echo 10/10/11 Low Normal LVEF. Trace MR and TR.  Lexiscan stress 04/17/12: Inf wall scar, no significant ischemia, small apical lateral moderate ischemia. EF 68%. low risk and no significant change compared to Nuclear stress 10/11/11.   EKG: EKG 11/26/2021: Normal sinus rhythm at rate of 71 bpm, left axis deviation, left anterior fascicular block.  Right bundle branch block.  LVH.  Nonspecific T abnormality.  No significant change from 11/28/2020.   Assessment     ICD-10-CM   1. Coronary artery disease involving native coronary artery of native heart without angina pectoris  I25.10 EKG 12-Lead    2. Essential hypertension  I10     3. Pure hypercholesterolemia  E78.00     4. Type 2 diabetes mellitus without complication, without long-term current use of insulin (HCC)  E11.9     Hold Lipitor  Will hold for 3 weeks for thrombocytopenia and check CBC  No orders of the defined types were placed in this encounter.  There are no discontinued medications.    Recommendations:   Claire Morgan is a 70 y.o. Caucasian female with history of diabetes mellitus which is controlled, hypertension, hyperlipidemia, CAD S/P myocardial infarction in 2005 and stent 2, 2.5 x 23 mm mid LAD stent for an MI and mid circumflex 2.5 x 23 mm Cypher DES, chronically  elevated LFTs. She now presents here for annual visit.  She is currently asymptomatic except for mild chronic dyspnea.  No clinical evidence of heart failure, she has not had any anginal symptoms, blood pressure is well controlled and lipids from external records reviewed and stable.  Diabetes is also well controlled.  She has chronically elevated LFTs due to fatty liver, weight loss again discussed with the patient.  No changes in the medications were done today, I will see him back on an annual basis.  Although she has not had any kind of stress testing or echocardiogram in >8 to 10 years, as her symptoms are remained stable and there is no change in physical exam, I did not order any testing.  Patient is aware to contact me if she has any symptoms of chest tightness or worsening dyspnea.  Adrian Prows, MD, [facility] 11/26/2021, 10:45 AM Office: [phone] Pager: [phone]

## 2022-02-09 ENCOUNTER — Other Ambulatory Visit: Payer: Self-pay | Admitting: Cardiology

## 2022-02-09 DIAGNOSIS — I10 Essential (primary) hypertension: Secondary | ICD-10-CM

## 2022-11-13 ENCOUNTER — Encounter: Payer: Self-pay | Admitting: Cardiology

## 2022-11-13 ENCOUNTER — Ambulatory Visit: Payer: Medicare Other | Admitting: Cardiology

## 2022-11-13 VITALS — BP 135/68 | HR 64 | Ht 62.0 in | Wt 192.0 lb

## 2022-11-13 DIAGNOSIS — I10 Essential (primary) hypertension: Secondary | ICD-10-CM

## 2022-11-13 DIAGNOSIS — E7800 Pure hypercholesterolemia, unspecified: Secondary | ICD-10-CM

## 2022-11-13 DIAGNOSIS — E119 Type 2 diabetes mellitus without complications: Secondary | ICD-10-CM

## 2022-11-13 DIAGNOSIS — I2510 Atherosclerotic heart disease of native coronary artery without angina pectoris: Secondary | ICD-10-CM

## 2022-11-13 MED ORDER — NITROGLYCERIN 0.4 MG SL SUBL: 0.4000 mg | SUBLINGUAL_TABLET | SUBLINGUAL | 3 refills | Status: AC | PRN

## 2022-11-13 MED ORDER — RYBELSUS 3 MG PO TABS: 1.0000 | ORAL_TABLET | Freq: Every day | ORAL | 2 refills | Status: DC

## 2022-11-13 NOTE — Progress Notes (Signed)
Primary Physician/Referring:  Vicenta Aly, FNP  Patient ID: Claire Morgan, female    DOB: 12/26/1951, 71 y.o.   MRN: [id number]  Chief Complaint  Patient presents with   Follow-up    1 year   Hypertension   Hyperlipidemia   Coronary Artery Disease   HPI:    Claire Morgan  is a 71 y.o. Caucasian female with history of diabetes mellitus, hypertension, hyperlipidemia, CAD S/P myocardial infarction in 2005 and stent 2, 2.5 x 23 mm mid LAD stent for an MI and mid circumflex 2.5 x 23 mm Cypher DES presents here for annual visit.  She has no specific complaints today, denies any exertional chest pain, she has mild chronic dyspnea that has remained stable, no change in her arthritis.  Past Medical History:  Diagnosis Date   Anemia    Coronary artery disease    Diabetes mellitus without complication ([place])    GERD (gastroesophageal reflux disease)    Hypertension    Myocardial infarction ([place]) 09/26/2004   PONV (postoperative nausea and vomiting)    Past Surgical History:  Procedure Laterality Date   [place]     CATARACT EXTRACTION Bilateral 2019   CHOLECYSTECTOMY     CORONARY ANGIOPLASTY  2005   TRIGGER FINGER RELEASE Right 06/10/2019   Procedure: RELEASE TRIGGER FINGER/A-1 PULLEY RIGHT THUMB AND RING;  Surgeon: Daryll Brod, MD;  Location: [place];  Service: Orthopedics;  Laterality: Right;  FAB   Social History   Tobacco Use   Smoking status: Former    Types: Cigarettes    Quit date: 09/27/2004    Years since quitting: 18.1   Smokeless tobacco: Never  Substance Use Topics   Alcohol use: Never   Marital Status: Married   ROS  Review of Systems  Cardiovascular:  Positive for dyspnea on exertion (stable). Negative for chest pain and leg swelling.  Gastrointestinal:  Negative for melena.   Objective      11/13/2022   10:08 AM 11/13/2022    9:51 AM 11/26/2021   10:22 AM  Vitals with BMI  Height   _0  _1   Weight  192 lbs 192 lbs 13 oz  BMI  74.25 95.63  Systolic 875 643 329  Diastolic 68 73 75  Pulse 64 67 71    Blood pressure 135/68, pulse 64, height _2  (1.575 m), weight 192 lb (87.1 kg), SpO2 96 %. Body mass index is 35.12 kg/m.   Physical Exam Constitutional:      Appearance: She is obese.  Neck:     Vascular: No carotid bruit or JVD.  Cardiovascular:     Rate and Rhythm: Normal rate and regular rhythm.     Pulses: Intact distal pulses.     Heart sounds: Normal heart sounds. No murmur heard.    No gallop.  Pulmonary:     Effort: Pulmonary effort is normal.     Breath sounds: Normal breath sounds.  Abdominal:     General: Bowel sounds are normal.     Palpations: Abdomen is soft.  Musculoskeletal:     Right lower leg: No edema.     Left lower leg: No edema.   Radiology: No results found.  Laboratory examination:   External labs:  Labs 06/21/2022:  A1c 6.6%.  Total cholesterol 133, triglycerides 93, HDL 57, LDL 59.  Serum glucose 120 mg, BUN 13, creatinine 0.65, EGFR 93 mL, potassium 4.4, LFTs normal.  Labs  06/20/2021:  Serum glucose 105 mg, BUN 11, creatinine 0.66, EGFR 94 mL, potassium 4.1.  AST normal, ALT minimally elevated at 37.  Total cholesterol 126, triglycerides 80, HDL 53, LDL 57.  Hb 13.2/HCT 40.6, platelets 162.  A1c 6.4%.   Medications and allergies   Allergies  Allergen Reactions   Contrast Media [Iodinated Contrast Media] Rash   Shellfish Allergy Rash   Eggs Or Egg-Derived Products Nausea Only   Fish Oil    Trulicity [Dulaglutide]     Dizzy, stomach upset    Tyloxapol Nausea And Vomiting    "makes me crazy"     Current Outpatient Medications:    aspirin 81 MG chewable tablet, Chew 81 mg by mouth daily., Disp: , Rfl:    atorvastatin (LIPITOR) 80 MG tablet, Take 1 tablet (80 mg total) by mouth daily., Disp: 90 tablet, Rfl: 3   B Complex-C (B-COMPLEX WITH VITAMIN C) tablet, Take 1 tablet by mouth daily., Disp: , Rfl:     calcium-vitamin D (OSCAL WITH D) 500-200 MG-UNIT tablet, Take 1 tablet by mouth., Disp: , Rfl:    Cholecalciferol (D3-1000) 25 MCG (1000 UT) capsule, Take 1,000 Units by mouth daily., Disp: , Rfl:    diazepam (VALIUM) 2 MG tablet, Take 2 mg by mouth every 6 (six) hours as needed., Disp: , Rfl:    folic acid (FOLVITE) 324 MCG tablet, Take 400 mcg by mouth daily., Disp: , Rfl:    glimepiride (AMARYL) 4 MG tablet, Take 4 mg by mouth daily with breakfast., Disp: , Rfl:    Lutein 20 MG CAPS, Take 1 capsule by mouth daily., Disp: , Rfl:    metFORMIN (GLUCOPHAGE) 500 MG tablet, Take 1,000 mg by mouth daily with breakfast., Disp: , Rfl:    metoprolol succinate (TOPROL-XL) 100 MG 24 hr tablet, Take 100 mg by mouth daily. Take with or immediately following a meal., Disp: , Rfl:    Multiple Vitamins-Minerals (PRESERVISION AREDS 2+MULTI VIT PO), Take 2 tablets by mouth daily., Disp: , Rfl:    omeprazole (PRILOSEC) 20 MG capsule, Take 20 mg by mouth daily., Disp: , Rfl:    ramipril (ALTACE) 10 MG capsule, Take 10 mg by mouth daily., Disp: , Rfl:    Semaglutide (RYBELSUS) 3 MG TABS, Take 1 tablet by mouth daily., Disp: 30 tablet, Rfl: 2   spironolactone-hydrochlorothiazide (ALDACTAZIDE) 25-25 MG tablet, TAKE 1 TABLET BY MOUTH ONCE DAILY IN THE MORNING, Disp: 90 tablet, Rfl: 0   nitroGLYCERIN (NITROSTAT) 0.4 MG SL tablet, Place 1 tablet (0.4 mg total) under the tongue every 5 (five) minutes as needed for chest pain., Disp: 25 tablet, Rfl: 3     Cardiac Studies:   Coronary Angiography  10-19-2023: 2.5x23 mid LAD for MI, Mid Circumflex 2.5x23 Cypher DES staged.  Echo 10/10/11 Low Normal LVEF. Trace MR and TR.  Lexiscan stress 04/17/12: Inf wall scar, no significant ischemia, small apical lateral moderate ischemia. EF 68%. low risk and no significant change compared to Nuclear stress 10/11/11.   EKG: EKG 11/13/2022: Normal sinus rhythm at rate of 63 bpm, left axis deviation, left anterior fascicular block.   Right bundle branch block.  Bifascicular block.  LVH.  Compared to 11/26/2021, no significant change.  Assessment     ICD-10-CM   1. Coronary artery disease involving native coronary artery of native heart without angina pectoris  I25.10 EKG 12-Lead    Semaglutide (RYBELSUS) 3 MG TABS    nitroGLYCERIN (NITROSTAT) 0.4 MG SL tablet    PCV ECHOCARDIOGRAM  COMPLETE    PCV MYOCARDIAL PERFUSION WO LEXISCAN    2. Essential hypertension  I10     3. Pure hypercholesterolemia  E78.00     4. Type 2 diabetes mellitus without complication, without long-term current use of insulin (HCC)  E11.9 Semaglutide (RYBELSUS) 3 MG TABS    5. Class 2 severe obesity due to excess calories with serious comorbidity and body mass index (BMI) of 35.0 to 35.9 in adult [facility])  E66.01    Z68.35     Hold Lipitor Will hold for 3 weeks for thrombocytopenia and check CBC  Meds ordered this encounter  Medications   Semaglutide (RYBELSUS) 3 MG TABS    Sig: Take 1 tablet by mouth daily.    Dispense:  30 tablet    Refill:  2   nitroGLYCERIN (NITROSTAT) 0.4 MG SL tablet    Sig: Place 1 tablet (0.4 mg total) under the tongue every 5 (five) minutes as needed for chest pain.    Dispense:  25 tablet    Refill:  3   Medications Discontinued During This Encounter  Medication Reason   nitroGLYCERIN (NITROSTAT) 0.4 MG SL tablet Reorder      Recommendations:   Claire Morgan is a 71 y.o. Caucasian female with history of diabetes mellitus, hypertension, hyperlipidemia, CAD S/P myocardial infarction in 2005 and stent 2, 2.5 x 23 mm mid LAD stent for an MI and mid circumflex 2.5 x 23 mm Cypher DES presents here for annual visit.  1. Coronary artery disease involving native coronary artery of native heart without angina pectoris Patient is presently doing well, has not had any recurrence of angina pectoris, however she would like to have nitroglycerin on hand, I have prescribed this.  Patient has not had any cardiac work-up for  most 10 years, will obtain echocardiogram and also exercise nuclear stress test.  Patient prefers Lexiscan however I have encouraged her to try doing treadmill exercise stress test.  Unless abnormal or high risk, I will see her back in a year.  2. Essential hypertension Blood pressure is well controlled, renal function has remained stable as well.  No changes were done by me today.  3. Pure hypercholesterolemia Reviewed her external labs, lipids under excellent control.  Continue present statin dose.  4. Type 2 diabetes mellitus without complication, without long-term current use of insulin ([place]) Patient has moderate obesity, she tried Trulicity but had significant GI upset, I discussed with her regarding reducing her food intake and also eating slowly and this would help with reduction in the side effects.  She is willing to try Rybelsus 3 mg daily.  5. Class 2 severe obesity due to excess calories with serious comorbidity and body mass index (BMI) of 35.0 to 35.9 in adult [facility]) Again discussed high risk with moderate obesity, diabetes mellitus and underlying coronary artery disease, weight loss and exercise discussed.    Adrian Prows, MD, [facility] 11/13/2022, 11:28 AM Office: [phone] Pager: [phone]

## 2022-11-13 NOTE — Patient Instructions (Signed)
The 1/2 tablet of metoprolol succinate1 day prior to the stress test, take do not take metoprolol succinate on the day of the stress test.

## 2022-11-27 ENCOUNTER — Ambulatory Visit: Payer: Medicare Other

## 2022-11-27 DIAGNOSIS — I2510 Atherosclerotic heart disease of native coronary artery without angina pectoris: Secondary | ICD-10-CM

## 2022-12-05 ENCOUNTER — Ambulatory Visit: Payer: Medicare Other

## 2022-12-05 DIAGNOSIS — I2510 Atherosclerotic heart disease of native coronary artery without angina pectoris: Secondary | ICD-10-CM

## 2022-12-08 LAB — PCV MYOCARDIAL PERFUSION WO LEXISCAN
Angina Index: 0
ST Depression (mm): 0 mm

## 2023-11-12 ENCOUNTER — Ambulatory Visit: Payer: Self-pay | Admitting: Cardiology

## 2023-11-18 ENCOUNTER — Telehealth: Payer: Self-pay | Admitting: *Deleted

## 2023-11-18 NOTE — Telephone Encounter (Signed)
S/w the pt and she is agreeable to sooner appt in office for pre op clearance. Pt has been scheduled to see Robin Searing, NP 11/20/23 @ 8:50. Pt is aware of [facility] address fro [address]. Pt would like to keep her appt with Dr. Jacinto Halim 01/27/24 as planned.   I will update all parties involved of the appt.

## 2023-11-18 NOTE — Telephone Encounter (Signed)
   Pre-operative Risk Assessment    Patient Name: EIMMY COUSINS  DOB: 1951-02-12 MRN: [id number]  DATE OF LAST VISIT: 11/13/22 DR. Jacinto Halim DATE OF NEXT VISIT: 01/27/24 DR. [facility]    Request for Surgical Clearance    Procedure:  Dental Extraction - Amount of Teeth to be Pulled:  3 TEETH FOR SURGICAL EXTRACTION  ALONG WITH PRIMARY CLOSURE ON SINUS PERFORATION  Date of Surgery:  Clearance TBD                                 Surgeon:  DR. Lincoln Brigham, DDS Surgeon's Group or Practice Name:  [facility] Phone number:  [phone] Fax number:  [phone]   Type of Clearance Requested:   - Medical  - Pharmacy:  Hold Aspirin     Type of Anesthesia:   FENTANYL, VERSED, ZOFRAN AND DEXAMETHASONE, LIDOCAINE WITH EPI 1:100000 2% MARCAINE WITH EPI 1:100000 0.5 %, AND EXPAREL   Additional requests/questions:    Elpidio Anis   11/18/2023, 10:26 AM

## 2023-11-18 NOTE — Telephone Encounter (Signed)
   Name: Claire Morgan  DOB: 1951/04/16  MRN: [id number]  Primary Cardiologist: None  Chart reviewed as part of pre-operative protocol coverage. Because of Claire Morgan's past medical history and time since last visit, she will require a follow-up in-office visit in order to better assess preoperative cardiovascular risk.  Patient is scheduled to see Dr. Jacinto Halim on 01/27/2024.  She will need a sooner visit if procedure is scheduled prior to the end of the year.  Pre-op covering staff: - Please schedule appointment and call patient to inform them. If patient already had an upcoming appointment within acceptable timeframe, please add "pre-op clearance" to the appointment notes so provider is aware. - Please contact requesting surgeon's office via preferred method (i.e, phone, fax) to inform them of need for appointment prior to surgery.  Napoleon Form, Leodis Rains, NP  11/18/2023, 10:35 AM

## 2023-11-19 NOTE — Progress Notes (Unsigned)
Cardiology Office Note    Patient Name: Claire Morgan Date of Encounter: 11/19/2023  Primary Care Provider:  Elizabeth Palau, FNP Primary Cardiologist:  None Primary Electrophysiologist: None   Past Medical History    Past Medical History:  Diagnosis Date   Anemia    Coronary artery disease    Diabetes mellitus without complication (HCC)    GERD (gastroesophageal reflux disease)    Hypertension    Myocardial infarction (HCC) 09/26/2004   PONV (postoperative nausea and vomiting)     History of Present Illness  Claire Morgan is a 72 y.o. female with a PMH of CAD s/p MI 2005 treated with DES to mid LAD, mid circumflex, HLD, HTN, DM type II, RBBB presents today for preoperative clearance.  Ms. Bryers has been followed by Dr. Jacinto Halim since 2020 for management of CAD.  She has a history of MI in 2005 with stents to the mid LAD and mid circumflex.  She underwent a Lexiscan Myoview/19/13 that showed no significant ischemia and was low risk.  She was last seen by Dr. Jacinto Halim on 11/13/2022 annual follow-up.  During her visit she was doing well with no cardiac complaints.  She had a repeat 2D echo completed that showed normal EF with no significant valve abnormalities.  She also underwent updated Lexiscan Myoview that showed no significant changes from 2013 with no evidence of ischemia low risk.  She reports today for preoperative clearance for wisdom tooth extraction.  The patient reports feeling more tired than usual, which she attributes to a lack of physical activity. She has been making an effort to move more, mainly through walking and some stretching. The patient is currently on metoprolol 100mg  and low-dose aspirin. She reports tolerating the medications well, although she acknowledges that the metoprolol could be contributing to her fatigue. The patient's cholesterol is well-controlled on medication. The patient admits to a poor diet, eating anything she wants, and acknowledges the need for  improvement in this area.   Patient denies chest pain, palpitations, dyspnea, PND, orthopnea, nausea, vomiting, dizziness, syncope, edema, weight gain, or early satiety.  Review of Systems  Please see the history of present illness.    All other systems reviewed and are otherwise negative except as noted above.  Physical Exam    Wt Readings from Last 3 Encounters:  11/13/22 192 lb (87.1 kg)  11/26/21 192 lb 12.8 oz (87.5 kg)  11/28/20 191 lb (86.6 kg)   VO:ZDGUY were no vitals filed for this visit.,There is no height or weight on file to calculate BMI. GEN: Well nourished, well developed in no acute distress Neck: No JVD; No carotid bruits Pulmonary: Clear to auscultation without rales, wheezing or rhonchi  Cardiovascular: Normal rate. Regular rhythm. Normal S1. Normal S2.   Murmurs: There is no murmur.  ABDOMEN: Soft, non-tender, non-distended EXTREMITIES:  No edema; No deformity   EKG/LABS/ Recent Cardiac Studies   ECG personally reviewed by me today -sinus rhythm with premature supraventricular complex and bifascicular block with rate of 75 bpm and no acute changes consistent with previous EKG.  Risk Assessment/Calculations:     Lab Results  Component Value Date   WBC 5.2 11/08/2019   HGB 14.0 11/08/2019   HCT 43.3 11/08/2019   MCV 91 11/08/2019   PLT 142 (L) 11/08/2019   Lab Results  Component Value Date   CREATININE 0.67 11/08/2019   BUN 10 11/08/2019   NA 141 11/08/2019   K 4.7 11/08/2019   CL 102 11/08/2019  CO2 24 11/08/2019   No results found for: "CHOL", "HDL", "LDLCALC", "LDLDIRECT", "TRIG", "CHOLHDL"  No results found for: "HGBA1C" Assessment & Plan    1.  Preoperative clearance: -Patient's RCRI score is 0.9%  The patient affirms she has been doing well without any new cardiac symptoms. They are able to achieve 7 METS without cardiac limitations. Therefore, based on ACC/AHA guidelines, the patient would be at acceptable risk for the planned procedure  without further cardiovascular testing. The patient was advised that if she develops new symptoms prior to surgery to contact our office to arrange for a follow-up visit, and she verbalized understanding.   Patient can hold ASA 81 mg 7 days prior to procedure and should restart postprocedure when surgically safe and hemostasis is achieved.  2.  History of CAD: -s/p MI 2005 with DES to mid LAD and mid circumflex and last ischemic evaluation 10/2022 by Steffanie Dunn was low risk and normal -Patient reports no chest pain or acute changes since previous visit.  3.  Essential hypertension: -Patient's blood pressure today was well-controlled at 122/70 -Continue Toprol XL 100 mg daily and Aldactazide 25/25 mg  4.  Hyperlipidemia: -Patient's last LDL cholesterol was 57   Continue Lipitor 80 mg daily  5.DM type II: -Patient's last hemoglobin A1c was 6.8 -Continue current treatment plan per PCP  Disposition: Follow-up with None or APP in 12 months    Signed, Napoleon Form, Leodis Rains, NP 11/19/2023, 9:55 AM [facility]

## 2023-11-20 ENCOUNTER — Ambulatory Visit: Payer: Medicare Other | Attending: Nurse Practitioner | Admitting: Nurse Practitioner

## 2023-11-20 ENCOUNTER — Encounter: Payer: Self-pay | Admitting: Nurse Practitioner

## 2023-11-20 VITALS — BP 122/70 | HR 73 | Resp 16 | Ht 62.0 in | Wt 194.0 lb

## 2023-11-20 DIAGNOSIS — I10 Essential (primary) hypertension: Secondary | ICD-10-CM | POA: Diagnosis not present

## 2023-11-20 DIAGNOSIS — Z01810 Encounter for preprocedural cardiovascular examination: Secondary | ICD-10-CM

## 2023-11-20 DIAGNOSIS — I2510 Atherosclerotic heart disease of native coronary artery without angina pectoris: Secondary | ICD-10-CM

## 2023-11-20 DIAGNOSIS — E7800 Pure hypercholesterolemia, unspecified: Secondary | ICD-10-CM | POA: Diagnosis not present

## 2023-11-20 DIAGNOSIS — E119 Type 2 diabetes mellitus without complications: Secondary | ICD-10-CM

## 2023-11-20 NOTE — Patient Instructions (Signed)
Medication Instructions:  Your physician recommends that you continue on your current medications as directed. Please refer to the Current Medication list given to you today. *If you need a refill on your cardiac medications before your next appointment, please call your pharmacy*   Lab Work: None ordered If you have labs (blood work) drawn today and your tests are completely normal, you will receive your results only by: MyChart Message (if you have MyChart) OR A paper copy in the mail If you have any lab test that is abnormal or we need to change your treatment, we will call you to review the results.   Testing/Procedures: None ordered   Follow-Up: At [facility], you and your health needs are our priority.  As part of our continuing mission to provide you with exceptional heart care, we have created designated Provider Care Teams.  These Care Teams include your primary Cardiologist (physician) and Advanced Practice Providers (APPs -  Physician Assistants and Nurse Practitioners) who all work together to provide you with the care you need, when you need it.  We recommend signing up for the patient portal called "MyChart".  Sign up information is provided on this After Visit Summary.  MyChart is used to connect with patients for Virtual Visits (Telemedicine).  Patients are able to view lab/test results, encounter notes, upcoming appointments, etc.  Non-urgent messages can be sent to your provider as well.   To learn more about what you can do with MyChart, go to [email or web address].    Your next appointment:   12 month(s)  Provider:   Yates Decamp, MD      Other Instructions You are cleared for your surgery.

## 2024-01-27 ENCOUNTER — Ambulatory Visit: Payer: Medicare Other | Admitting: Cardiology
